# Patient Record
Sex: Female | Born: 1980 | Race: White | Hispanic: No | Smoking: Former smoker
Health system: Southern US, Community
[De-identification: ages and names within clinical notes are randomized; demographics above are authoritative.]

## PROBLEM LIST (undated history)

## (undated) DIAGNOSIS — T4145XA Adverse effect of unspecified anesthetic, initial encounter: Secondary | ICD-10-CM

## (undated) DIAGNOSIS — B9681 Helicobacter pylori [H. pylori] as the cause of diseases classified elsewhere: Secondary | ICD-10-CM

## (undated) DIAGNOSIS — K2970 Gastritis, unspecified, without bleeding: Secondary | ICD-10-CM

## (undated) DIAGNOSIS — N83209 Unspecified ovarian cyst, unspecified side: Secondary | ICD-10-CM

## (undated) DIAGNOSIS — R569 Unspecified convulsions: Secondary | ICD-10-CM

## (undated) HISTORY — DX: Helicobacter pylori (H. pylori) as the cause of diseases classified elsewhere: B96.81

## (undated) HISTORY — DX: Helicobacter pylori (H. pylori) as the cause of diseases classified elsewhere: K29.70

## (undated) HISTORY — PX: BRAIN SURGERY: SHX531

## (undated) HISTORY — DX: Unspecified convulsions: R56.9

## (undated) HISTORY — DX: Unspecified ovarian cyst, unspecified side: N83.209

## (undated) HISTORY — PX: KNEE SURGERY: SHX244

## (undated) HISTORY — PX: ABDOMINAL HYSTERECTOMY: SHX81

---
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
## 2003-04-07 ENCOUNTER — Inpatient Hospital Stay (HOSPITAL_COMMUNITY): Admission: EM | Admit: 2003-04-07 | Discharge: 2003-04-10 | Payer: Self-pay | Admitting: Psychiatry

## 2003-04-08 ENCOUNTER — Encounter: Payer: Self-pay | Admitting: Emergency Medicine

## 2004-07-03 ENCOUNTER — Emergency Department: Payer: Self-pay | Admitting: Emergency Medicine

## 2004-08-21 ENCOUNTER — Emergency Department: Payer: Self-pay | Admitting: Emergency Medicine

## 2006-05-14 ENCOUNTER — Emergency Department: Payer: Self-pay | Admitting: Emergency Medicine

## 2006-06-30 ENCOUNTER — Emergency Department: Payer: Self-pay

## 2007-03-29 ENCOUNTER — Emergency Department: Payer: Self-pay

## 2007-07-02 ENCOUNTER — Emergency Department: Payer: Self-pay | Admitting: Emergency Medicine

## 2007-08-21 ENCOUNTER — Ambulatory Visit: Payer: Self-pay | Admitting: Physician Assistant

## 2007-08-23 HISTORY — PX: OTHER SURGICAL HISTORY: SHX169

## 2007-11-05 ENCOUNTER — Emergency Department: Payer: Self-pay | Admitting: Emergency Medicine

## 2007-11-07 ENCOUNTER — Ambulatory Visit: Payer: Self-pay | Admitting: Family Medicine

## 2007-11-09 ENCOUNTER — Emergency Department: Payer: Self-pay | Admitting: Emergency Medicine

## 2008-05-19 ENCOUNTER — Emergency Department: Payer: Self-pay | Admitting: Emergency Medicine

## 2008-06-14 ENCOUNTER — Emergency Department: Payer: Self-pay | Admitting: Emergency Medicine

## 2008-09-07 ENCOUNTER — Emergency Department: Payer: Self-pay | Admitting: Emergency Medicine

## 2008-09-12 ENCOUNTER — Ambulatory Visit: Payer: Self-pay | Admitting: Obstetrics and Gynecology

## 2008-09-15 ENCOUNTER — Inpatient Hospital Stay: Payer: Self-pay | Admitting: Obstetrics and Gynecology

## 2008-10-09 ENCOUNTER — Ambulatory Visit: Payer: Self-pay | Admitting: Obstetrics and Gynecology

## 2009-07-17 ENCOUNTER — Emergency Department: Payer: Self-pay | Admitting: Emergency Medicine

## 2010-01-07 DIAGNOSIS — L709 Acne, unspecified: Secondary | ICD-10-CM | POA: Insufficient documentation

## 2010-12-25 ENCOUNTER — Emergency Department: Payer: Self-pay | Admitting: Emergency Medicine

## 2011-02-06 ENCOUNTER — Emergency Department: Payer: Self-pay | Admitting: Emergency Medicine

## 2011-03-26 ENCOUNTER — Emergency Department: Payer: Self-pay | Admitting: Emergency Medicine

## 2011-06-16 ENCOUNTER — Emergency Department: Payer: Self-pay | Admitting: Emergency Medicine

## 2011-07-19 ENCOUNTER — Ambulatory Visit: Payer: Self-pay | Admitting: Physician Assistant

## 2011-10-20 ENCOUNTER — Ambulatory Visit: Payer: Self-pay | Admitting: Family Medicine

## 2011-12-23 ENCOUNTER — Ambulatory Visit: Payer: Self-pay | Admitting: Family Medicine

## 2012-05-12 ENCOUNTER — Emergency Department: Payer: Self-pay | Admitting: Emergency Medicine

## 2012-05-12 LAB — URINALYSIS, COMPLETE
Bacteria: NONE SEEN
Blood: NEGATIVE
Glucose,UR: NEGATIVE mg/dL (ref 0–75)
Ketone: NEGATIVE
Specific Gravity: 1.025 (ref 1.003–1.030)
Squamous Epithelial: 8
WBC UR: 1 /HPF (ref 0–5)

## 2012-05-12 LAB — COMPREHENSIVE METABOLIC PANEL
Anion Gap: 9 (ref 7–16)
BUN: 14 mg/dL (ref 7–18)
Calcium, Total: 8.5 mg/dL (ref 8.5–10.1)
Chloride: 105 mmol/L (ref 98–107)
Co2: 28 mmol/L (ref 21–32)
EGFR (African American): >60
EGFR (Non-African Amer.): >60
Potassium: 3.9 mmol/L (ref 3.5–5.1)
SGOT(AST): 15 U/L (ref 15–37)
SGPT (ALT): 17 U/L (ref 12–78)

## 2012-05-12 LAB — CBC
HCT: 41.9 % (ref 35.0–47.0)
HGB: 14.4 g/dL (ref 12.0–16.0)
MCH: 31.7 pg (ref 26.0–34.0)
MCHC: 34.3 g/dL (ref 32.0–36.0)
MCV: 92 fL (ref 80–100)
RDW: 12.5 % (ref 11.5–14.5)

## 2012-05-17 ENCOUNTER — Ambulatory Visit: Payer: Self-pay | Admitting: Obstetrics and Gynecology

## 2012-05-17 LAB — PHENYTOIN LEVEL, TOTAL: Dilantin: 0.8 ug/mL — ABNORMAL LOW (ref 10.0–20.0)

## 2012-05-17 LAB — BASIC METABOLIC PANEL
Anion Gap: 7 (ref 7–16)
BUN: 10 mg/dL (ref 7–18)
Calcium, Total: 8.7 mg/dL (ref 8.5–10.1)
Chloride: 107 mmol/L (ref 98–107)
Co2: 26 mmol/L (ref 21–32)
Creatinine: 0.61 mg/dL (ref 0.60–1.30)
Osmolality: 277 (ref 275–301)

## 2012-05-17 LAB — PHENOBARBITAL LEVEL: Phenobarbital: 3 ug/mL — ABNORMAL LOW (ref 15.0–40.0)

## 2012-05-17 LAB — HEMOGLOBIN: HGB: 14.3 g/dL (ref 12.0–16.0)

## 2012-05-18 ENCOUNTER — Ambulatory Visit: Payer: Self-pay | Admitting: Obstetrics and Gynecology

## 2012-07-25 ENCOUNTER — Emergency Department: Payer: Self-pay | Admitting: Emergency Medicine

## 2012-07-25 LAB — CBC
HGB: 12.5 g/dL (ref 12.0–16.0)
MCH: 29.2 pg (ref 26.0–34.0)
MCHC: 31.8 g/dL — ABNORMAL LOW (ref 32.0–36.0)
MCV: 92 fL (ref 80–100)
Platelet: 234 10*3/uL (ref 150–440)
RBC: 4.28 10*6/uL (ref 3.80–5.20)
RDW: 13.2 % (ref 11.5–14.5)
WBC: 7.9 10*3/uL (ref 3.6–11.0)

## 2012-07-25 LAB — BASIC METABOLIC PANEL
Calcium, Total: 8.4 mg/dL — ABNORMAL LOW (ref 8.5–10.1)
Chloride: 105 mmol/L (ref 98–107)
EGFR (Non-African Amer.): >60
Glucose: 85 mg/dL (ref 65–99)
Potassium: 3.8 mmol/L (ref 3.5–5.1)
Sodium: 137 mmol/L (ref 136–145)

## 2012-07-25 LAB — URINALYSIS, COMPLETE
Bilirubin,UR: NEGATIVE
Glucose,UR: NEGATIVE mg/dL (ref 0–75)
Leukocyte Esterase: NEGATIVE
Nitrite: NEGATIVE
RBC,UR: <1 /HPF (ref 0–5)
Squamous Epithelial: <1

## 2012-07-25 LAB — HCG, QUANTITATIVE, PREGNANCY: Beta Hcg, Quant.: <1 m[IU]/mL — ABNORMAL LOW

## 2012-08-06 ENCOUNTER — Ambulatory Visit: Payer: Self-pay | Admitting: Obstetrics and Gynecology

## 2012-08-27 ENCOUNTER — Ambulatory Visit: Payer: Self-pay | Admitting: Obstetrics and Gynecology

## 2012-08-27 LAB — BASIC METABOLIC PANEL
Anion Gap: 7 (ref 7–16)
Chloride: 110 mmol/L — ABNORMAL HIGH (ref 98–107)
Co2: 27 mmol/L (ref 21–32)
Creatinine: 0.52 mg/dL — ABNORMAL LOW (ref 0.60–1.30)
EGFR (African American): >60
EGFR (Non-African Amer.): >60
Glucose: 85 mg/dL (ref 65–99)
Potassium: 3.8 mmol/L (ref 3.5–5.1)

## 2012-08-27 LAB — HEMOGLOBIN: HGB: 12.8 g/dL (ref 12.0–16.0)

## 2012-08-28 LAB — PREGNANCY, URINE: Pregnancy Test, Urine: NEGATIVE m[IU]/mL

## 2012-08-29 ENCOUNTER — Ambulatory Visit: Payer: Self-pay | Admitting: Obstetrics and Gynecology

## 2012-08-29 HISTORY — PX: SUPRACERVICAL ABDOMINAL HYSTERECTOMY: SHX5393

## 2012-08-29 HISTORY — PX: UNILATERAL SALPINGECTOMY: SHX6160

## 2012-08-30 LAB — BASIC METABOLIC PANEL
Anion Gap: 7 (ref 7–16)
BUN: 14 mg/dL (ref 7–18)
Creatinine: 0.72 mg/dL (ref 0.60–1.30)
EGFR (African American): >60
Glucose: 102 mg/dL — ABNORMAL HIGH (ref 65–99)
Osmolality: 286 (ref 275–301)
Sodium: 143 mmol/L (ref 136–145)

## 2012-08-30 LAB — HEMATOCRIT: HCT: 37.3 % (ref 35.0–47.0)

## 2012-09-22 DIAGNOSIS — T8859XA Other complications of anesthesia, initial encounter: Secondary | ICD-10-CM

## 2012-09-22 HISTORY — DX: Other complications of anesthesia, initial encounter: T88.59XA

## 2012-10-07 ENCOUNTER — Emergency Department: Payer: Self-pay | Admitting: Emergency Medicine

## 2012-10-07 LAB — COMPREHENSIVE METABOLIC PANEL
Albumin: 3.9 g/dL (ref 3.4–5.0)
Anion Gap: 5 — ABNORMAL LOW (ref 7–16)
Chloride: 108 mmol/L — ABNORMAL HIGH (ref 98–107)
EGFR (African American): >60
EGFR (Non-African Amer.): >60
SGOT(AST): 18 U/L (ref 15–37)
Sodium: 142 mmol/L (ref 136–145)
Total Protein: 7.4 g/dL (ref 6.4–8.2)

## 2012-10-07 LAB — URINALYSIS, COMPLETE
Bilirubin,UR: NEGATIVE
Blood: NEGATIVE
Glucose,UR: NEGATIVE mg/dL (ref 0–75)
Leukocyte Esterase: NEGATIVE
RBC,UR: 1 /HPF (ref 0–5)
Squamous Epithelial: 4
WBC UR: 1 /HPF (ref 0–5)

## 2012-10-07 LAB — CBC
HCT: 42.6 % (ref 35.0–47.0)
HGB: 14.3 g/dL (ref 12.0–16.0)
MCH: 31.5 pg (ref 26.0–34.0)
MCV: 94 fL (ref 80–100)
RDW: 12.8 % (ref 11.5–14.5)
WBC: 6.3 10*3/uL (ref 3.6–11.0)

## 2012-10-07 LAB — PREGNANCY, URINE: Pregnancy Test, Urine: NEGATIVE m[IU]/mL

## 2012-11-14 ENCOUNTER — Emergency Department: Payer: Self-pay | Admitting: Emergency Medicine

## 2013-01-03 ENCOUNTER — Emergency Department: Payer: Self-pay | Admitting: Emergency Medicine

## 2013-01-07 ENCOUNTER — Ambulatory Visit: Payer: Self-pay | Admitting: Obstetrics and Gynecology

## 2013-05-10 ENCOUNTER — Emergency Department: Payer: Self-pay | Admitting: Emergency Medicine

## 2013-05-10 LAB — URINALYSIS, COMPLETE
Bilirubin,UR: NEGATIVE
Blood: NEGATIVE
Glucose,UR: NEGATIVE mg/dL (ref 0–75)
Leukocyte Esterase: NEGATIVE
Nitrite: NEGATIVE
Ph: 7 (ref 4.5–8.0)
Protein: NEGATIVE
WBC UR: 3 /HPF (ref 0–5)

## 2013-05-10 LAB — CBC
HGB: 13.6 g/dL (ref 12.0–16.0)
MCH: 32.2 pg (ref 26.0–34.0)
MCHC: 33.8 g/dL (ref 32.0–36.0)
WBC: 7.6 10*3/uL (ref 3.6–11.0)

## 2013-05-10 LAB — BASIC METABOLIC PANEL
BUN: 14 mg/dL (ref 7–18)
Co2: 29 mmol/L (ref 21–32)
Creatinine: 0.68 mg/dL (ref 0.60–1.30)
EGFR (Non-African Amer.): >60
Glucose: 97 mg/dL (ref 65–99)
Sodium: 141 mmol/L (ref 136–145)

## 2013-05-10 LAB — WET PREP, GENITAL

## 2013-08-31 ENCOUNTER — Emergency Department: Payer: Self-pay | Admitting: Emergency Medicine

## 2013-08-31 LAB — COMPREHENSIVE METABOLIC PANEL
ALK PHOS: 78 U/L
Albumin: 3.5 g/dL (ref 3.4–5.0)
Anion Gap: 5 — ABNORMAL LOW (ref 7–16)
BILIRUBIN TOTAL: 0.2 mg/dL (ref 0.2–1.0)
BUN: 12 mg/dL (ref 7–18)
CHLORIDE: 108 mmol/L — AB (ref 98–107)
Calcium, Total: 8.5 mg/dL (ref 8.5–10.1)
Co2: 26 mmol/L (ref 21–32)
Creatinine: 0.63 mg/dL (ref 0.60–1.30)
EGFR (African American): >60
Glucose: 84 mg/dL (ref 65–99)
Osmolality: 276 (ref 275–301)
Potassium: 4.1 mmol/L (ref 3.5–5.1)
SGOT(AST): 11 U/L — ABNORMAL LOW (ref 15–37)
SGPT (ALT): 24 U/L (ref 12–78)
Sodium: 139 mmol/L (ref 136–145)
Total Protein: 6.8 g/dL (ref 6.4–8.2)

## 2013-08-31 LAB — CBC WITH DIFFERENTIAL/PLATELET
Basophil #: 0 10*3/uL (ref 0.0–0.1)
Basophil %: 0.7 %
EOS PCT: 0.5 %
Eosinophil #: 0 10*3/uL (ref 0.0–0.7)
HCT: 39.8 % (ref 35.0–47.0)
HGB: 13.6 g/dL (ref 12.0–16.0)
LYMPHS PCT: 40.2 %
Lymphocyte #: 2.7 10*3/uL (ref 1.0–3.6)
MCH: 31.5 pg (ref 26.0–34.0)
MCHC: 34.1 g/dL (ref 32.0–36.0)
MCV: 92 fL (ref 80–100)
Monocyte #: 0.5 x10 3/mm (ref 0.2–0.9)
Monocyte %: 7.7 %
Neutrophil #: 3.4 10*3/uL (ref 1.4–6.5)
Neutrophil %: 50.9 %
PLATELETS: 212 10*3/uL (ref 150–440)
RBC: 4.32 10*6/uL (ref 3.80–5.20)
RDW: 13 % (ref 11.5–14.5)
WBC: 6.7 10*3/uL (ref 3.6–11.0)

## 2013-08-31 LAB — URINALYSIS, COMPLETE
BLOOD: NEGATIVE
Bilirubin,UR: NEGATIVE
GLUCOSE, UR: NEGATIVE mg/dL (ref 0–75)
Leukocyte Esterase: NEGATIVE
Nitrite: NEGATIVE
Ph: 5 (ref 4.5–8.0)
Protein: 30
Specific Gravity: 1.032 (ref 1.003–1.030)
Squamous Epithelial: 8
WBC UR: 1 /HPF (ref 0–5)

## 2014-12-09 NOTE — Op Note (Signed)
PATIENT NAME:  Wendy Ferguson, Wendy Ferguson MR#:  [id number] DATE OF BIRTH:  [birth year]  DATE OF PROCEDURE:  05/18/2012  PREOPERATIVE DIAGNOSES:   1. Acute onset right lower quadrant pain.  2. Right ovarian cyst.  POSTOPERATIVE DIAGNOSES:  1. Extensive pelvic adhesions. 2. Right ovarian cyst.   PROCEDURES:  1. Laparoscopic adhesiolysis extensive.  2. Right ovarian cystotomy.  3. Cystoscopy.   SURGEON:  Suzy Bouchardhomas J. Anhar Mcdermott, MD.   ANESTHESIA: General endotracheal anesthesia.   INDICATIONS: This is a 34 year old gravida 1, para 1 patient with a four day history of acute onset right lower quadrant pain that has been constant. The patient had an ultrasound that showed a 2 x 2 cm ovarian cyst. The patient is status post an exploratory laparotomy with a left ovarian cystectomy consistent with dermoid and also an appendectomy.   PROCEDURE: After adequate general endotracheal anesthesia, the patient was placed in the dorsal supine position with the legs in the [employer] stirrups. Abdominal, perineal and vaginal prep performed with Betadine and sterile dressing applied. A single-tooth tenaculum was then applied to the anterior cervix after inserting a speculum and a Kahn cannula placed into the endocervical cavity to be used for uterine manipulation during the procedure. A 12 mm infraumbilical incision was made. Upon prior injection with 0.5% Marcaine, the laparoscope was advanced into the abdominal cavity under direct visualization with the Optiview cannula. The patient's abdomen was insufflated. Initial impression had marked adhesions, omental adhesions to the anterior abdominal wall, several, had dense adhesions of the uterus to the anterior abdominal wall. At the second port site, a 10 mm port was advanced through the left lower quadrant area 2 cm medial to the left anterior iliac spine. Direct visualization used to place port. Third port site was placed in the right lower quadrant, again, 2 cm medial to the  right anterior iliac spine. Harmonic scalpel was brought up for the next 75% of the case operating time. Adhesiolysis ensued with first taking down omental adhesions that were close to the infraumbilical port site. Once these were freed up, the patient's left round ligament was clamped, cauterized and excised and then peritoneal and subperitoneal incisions were made to free the dense adhesions of the uterus to the anterior abdominal wall. Ultimately, the uterus was freed. Good hemostasis was noted. Attention was then directed to the patient's right fallopian tube which had a corpus luteum on this. It was approximately 4 x 4 centimeters in diameter. Given the prior history of dermoid, an ovarian cystotomy was performed and clear fluid resulted. There was no evidence of a dermoid in this ovary. Kleppinger was used to control the oozing at the capsule site. Attention was directed to the patient's left fallopian tube and ovary which appeared normal. There was a dense adhesion from the colon to the left ovary. This was taken down sharply without any involvement of the bowel. Given the extensive adhesiolysis on the left side, peristaltic activity of the ureter could not be ascertained. Therefore, cystoscope was brought up to the operative field and the patient received intravenous indigo carmine. Ureters were evaluated and there was a normal pluming effect efflux from each ureter identified. Attention was then directed to the patient's abdomen. Good hemostasis noted intraoperatively. Intraoperative pressure was brought down to 6 mmHg and good hemostasis noted. All instruments were removed. The patient's abdomen was deflated and incisions were closed at the fascial layer at the infraumbilical and left lower port sites and then skin was reapproximated with interrupted 4-0 Vicryl sutures  and Steri-Strips applied and a pressure dressing. A single-tooth tenaculum was removed from the anterior cervix. Silver nitrate applied to  the cervix to help with hemostasis. There were no complications. Estimated blood loss minimal. Intraoperative fluids 1 liter. The patient was taken to the recovery room in good condition.   ____________________________ Suzy Bouchard, MD tjs:ap D: 05/18/2012 12:36:31 ET T: 05/18/2012 13:52:57 ET JOB#: [id number]  cc: Suzy Bouchard, MD, <Dictator> Suzy Bouchard MD ELECTRONICALLY SIGNED 05/19/2012 9:42

## 2014-12-12 NOTE — Op Note (Signed)
PATIENT NAME:  Wendy Ferguson, Wendy Ferguson MR#:  [id number] DATE OF BIRTH:  1981/05/29  DATE OF PROCEDURE:  08/29/2012  PREOPERATIVE DIAGNOSIS: Chronic pelvic pain, right-sided.   POSTOPERATIVE DIAGNOSIS: Chronic pelvic pain, right-sided.   PROCEDURE:  1. Laparoscopic supracervical hysterectomy.  2. Left salpingectomy.  3. Right salpingo-oophorectomy.   ANESTHESIA: Endotracheal anesthesia.   SURGEON: Suzy Bouchard, MD   FIRST ASSISTANT: Ricky L. Logan Bores, MD   INDICATIONS: A 34 year old gravida 1, para 1 patient with a history of chronic pelvic pain, right greater than left. The patient is status post laparoscopic adhesiolysis, with temporary relief of pain, but pain has returned. The patient does have a history of heavy menstrual flow. The patient declines future childbearing capacity.   PROCEDURE: After adequate general endotracheal anesthesia, the patient was placed in the dorsal supine position. The patient's legs were placed in the [facility] stirrups. The patient's abdomen, perineum and vagina were prepped and draped in normal sterile fashion. A speculum was placed in the vagina, and the anterior cervix was grasped with a single-tooth tenaculum and a uterine sound placed into the endometrial cavity. Tenaculum and sound were joined with Steri-Strips. These will be used for uterine manipulation during the procedure. A Foley catheter placed, yielding 100 mL of clear urine. Gloves were changed. A 12 mm infraumbilical incision was made after injecting with 0.5% Marcaine. The laparoscope was advanced into the abdominal cavity under direct visualization with the Optiview cannula. Upon entry into the abdominal cavity, the patient's abdomen was insufflated with carbon dioxide. A #10-11 port was then placed at previously placed port from last surgery. Under direct visualization, the trocar was advanced after injecting with 0.5% Marcaine. Similarly, a third port, a 10-11 mm port was placed on the right lower  quadrant, direct visualization used to gain entrance into the abdominal cavity. Initial impression: No significant adhesions, some anterior adhesions to the lower uterine segment that were dense in nature. The upper abdomen appeared normal. A small omental adhesion to the umbilicus. No bowel incorporated in this. The appendix was not visualized. No significant adhesions in the right aspect of the abdomen. Attention was then directed to the patient's left fallopian tube, which was grasped at the fimbriated portion. Harmonic scalpel was used to dissect the left fallopian tube, and this was removed for pathologic evaluation. The round ligament on the left was then clamped and transected with the Harmonic scalpel. The utero-ovarian ligament was then cauterized with the Kleppinger's and transected from the uterus, and several bites were used to clamp the left broad ligament and transect it, and dissection continued down to the isthmic portion of the uterus, where the left uterine artery was visualized. Anterior bladder was taken down sharply from the lower uterine segment. Kleppinger cautery was used to cauterize the left uterine artery. Harmonic used to transect and to dissect to the cervical stump. A similar procedure was repeated on the patient's right side. The right tube and ovary were grasped and placed on medial traction, while the infundibulopelvic ligament was cauterized and opened. Serial clamps through the right broad ligament with the Harmonic scalpel, and again, the right uterine artery was identified, cauterized and transected, and cervical stump was then transected and removed from the uterine sound. The uterine sound was removed, and the remaining cervical stump was then cauterized with the Kleppinger's, aided by a vaginal hand. Good hemostasis was noted. The morcellator was then brought up to the operative field and advanced through the left lower port site, and the uterus, right  fallopian tube and ovary  were then morcellated. The patient's abdomen was copiously irrigated. Good hemostasis noted. Pressure was lowered to 7 mmHg, and good hemostasis still noted. All instruments were removed after deflating the patient's abdomen, and the infraumbilical and left lower port sites were closed with a fascial layer of 2-0 Vicryl and interrupted 4-0 Vicryl for the skin, and the right port site closed with interrupted 4-0 Vicryl skin suture. Sterile dressings applied. Cervix was evaluated at the end of the case. No active bleeding. The patient tolerated the procedure well. Estimated blood loss 25 mL. Intraoperative fluids 1000 mL. Urine output 100 mL. The patient did receive 2 grams of cefoxitin prior to commencement of the case, and the patient was taken to the recovery room in good condition.   ____________________________ Suzy Bouchardhomas J. Schermerhorn, MD tjs:OSi D: 08/29/2012 11:10:57 ET T: 08/29/2012 11:47:34 ET JOB#: [id number]  cc: Suzy Bouchardhomas J. Schermerhorn, MD, <Dictator> Suzy BouchardHOMAS J SCHERMERHORN MD ELECTRONICALLY SIGNED 09/06/2012 10:53

## 2014-12-12 NOTE — Discharge Summary (Signed)
PATIENT NAME:  Wendy Ferguson, Oberia E MR#:  [id number] DATE OF BIRTH:  Oct 11, 1980  DATE OF ADMISSION:  08/29/2012 DATE OF DISCHARGE:  08/30/2012  HOSPITAL COURSE: The patient underwent uncomplicated laparoscopic supracervical hysterectomy, right salpingo-oophorectomy and a left salpingectomy for chronic pelvic pain. Postoperatively, the patient did well. Postoperative day #1, hematocrit 37.3%, BUN and creatinine of 14 and 0.7. The patient was tolerating a regular diet on discharge, pain under control handled with Percocet.   DISCHARGE MEDICATIONS: Percocet 7.5/325 one to two every 6 hours as needed for pain, Naprosyn 500 mg 1 tablet q.12 hours as needed for pain, Colace 100 mg daily. The patient will continue on her Dilantin and phenobarbital at home.   DISCHARGE INSTRUCTIONS: The patient is given precautions to return to clinic in 2 weeks for wound care or before if she has increasing abdominal pain, nausea, vomiting, fever. The patient is to refrain from sexual activity and nothing in the vagina.     ____________________________ Suzy Bouchardhomas J. Schermerhorn, MD tjs:es D: 08/30/2012 09:54:57 ET T: 08/30/2012 10:21:11 ET JOB#: [id number]  cc: Suzy Bouchardhomas J. Schermerhorn, MD, <Dictator> Suzy BouchardHOMAS J SCHERMERHORN MD ELECTRONICALLY SIGNED 09/06/2012 10:53

## 2014-12-24 LAB — CBC AND DIFFERENTIAL
HCT: 41 % (ref 36–46)
Hemoglobin: 13.7 g/dL (ref 12.0–16.0)
PLATELETS: 338 10*3/uL (ref 150–399)
WBC: 6.2 10*3/mL

## 2015-01-15 LAB — HEPATIC FUNCTION PANEL
ALT: 42 U/L — AB (ref 7–35)
AST: 20 U/L (ref 13–35)

## 2015-01-15 LAB — BASIC METABOLIC PANEL
BUN: 11 mg/dL (ref 4–21)
CREATININE: 0.7 mg/dL (ref 0.5–1.1)
GLUCOSE: 78 mg/dL
POTASSIUM: 4.9 mmol/L (ref 3.4–5.3)
Sodium: 139 mmol/L (ref 137–147)

## 2015-01-15 LAB — TSH: TSH: 1.7 u[IU]/mL (ref 0.41–5.90)

## 2015-01-26 ENCOUNTER — Telehealth: Payer: Self-pay

## 2015-01-26 NOTE — Telephone Encounter (Signed)
Probably related to her medications since the rest of her labs were normal. . She will need to discuss this with her neurologist.

## 2015-01-26 NOTE — Telephone Encounter (Signed)
Tried calling patient. Left message to call back. 

## 2015-01-26 NOTE — Telephone Encounter (Signed)
Patient called stating she was seen on 01/15/2015 and was started on Vitamin D. Patient has been taking vitamin D as prescribes and states her symptoms of fatigue have improved, but she also reports that she still feels lightheaded and has an unstable gait. Patient still feels weak and has no energy at times. Patient thinks there may be something else going on with her body. Patient wants advise on what she should do.

## 2015-01-27 NOTE — Telephone Encounter (Signed)
Patient advised and verbally voiced understanding.  

## 2015-02-02 ENCOUNTER — Telehealth: Payer: Self-pay | Admitting: Family Medicine

## 2015-02-02 NOTE — Telephone Encounter (Signed)
Pt wanted to speak with Dr. Theodis Aguas nurse because she doesn't think she needs to go to the neuro appt because now that she has been on Vitamin D she feels a lot better. I advised that I would send the message to the nurse but if she would like that appt with Dr. Malvin Johns canceled she would need to call that office. Thanks TNP

## 2015-02-03 NOTE — Telephone Encounter (Signed)
Patient stated that she is going to go to the neurologist but she pushed the date back due to financial reasons. Patient has appt on 7/25 at 10:00 am. Patient stated that she is feeling a lot better and more normal since starting vitamin D daily.

## 2015-02-20 ENCOUNTER — Telehealth: Payer: Self-pay | Admitting: Family Medicine

## 2015-02-20 NOTE — Telephone Encounter (Signed)
Ok to take over the counter Claritin, or Zyrtec, without "D", also can try otc nasal spray, like Flonase. That works very well. Thanks.

## 2015-02-20 NOTE — Telephone Encounter (Signed)
Patient advised and verbally voiced understanding.  

## 2015-02-20 NOTE — Telephone Encounter (Signed)
Pt called saying she was having allergies really bad and needs to know something that she can take without benedryl.  CB [phone]  tp

## 2015-03-16 DIAGNOSIS — R569 Unspecified convulsions: Secondary | ICD-10-CM | POA: Insufficient documentation

## 2015-03-20 ENCOUNTER — Other Ambulatory Visit: Payer: Self-pay | Admitting: Family Medicine

## 2015-03-20 DIAGNOSIS — M25561 Pain in right knee: Secondary | ICD-10-CM | POA: Insufficient documentation

## 2015-03-23 ENCOUNTER — Other Ambulatory Visit: Payer: Self-pay | Admitting: Neurology

## 2015-03-23 DIAGNOSIS — R569 Unspecified convulsions: Secondary | ICD-10-CM

## 2015-03-25 ENCOUNTER — Other Ambulatory Visit: Payer: Self-pay | Admitting: Family Medicine

## 2015-03-26 NOTE — Telephone Encounter (Signed)
Please call in the following medication.  [facility] [phone]   Surescripts Out Interface  Yesterday   (4:46 PM)         New medications from outside sources are available for reconciliation.       Requested Medications     Medication name:  Name from pharmacy:  PHENobarbital (LUMINAL) 64.8 MG tablet PHENOBARBITAL 64.8MG  TAB    Sig: TAKE ONE TABLET TWICE DAILY    Dispense: 60 tablet (Pharmacy requested 60 each)    rf x 2     .

## 2015-03-26 NOTE — Telephone Encounter (Signed)
Next ov is on 04/28/2015

## 2015-03-27 NOTE — Telephone Encounter (Signed)
RX called in-aa 

## 2015-04-21 DIAGNOSIS — IMO0001 Reserved for inherently not codable concepts without codable children: Secondary | ICD-10-CM | POA: Insufficient documentation

## 2015-04-21 DIAGNOSIS — R519 Headache, unspecified: Secondary | ICD-10-CM | POA: Insufficient documentation

## 2015-04-21 DIAGNOSIS — R51 Headache: Secondary | ICD-10-CM

## 2015-04-21 DIAGNOSIS — R6889 Other general symptoms and signs: Secondary | ICD-10-CM

## 2015-04-28 ENCOUNTER — Ambulatory Visit: Payer: Medicaid Other

## 2015-04-30 ENCOUNTER — Telehealth: Payer: Self-pay

## 2015-04-30 NOTE — Telephone Encounter (Signed)
Mardella Layman from [facility]  called on 04/29/2015 and spoke with our call a nurse line. Per the message that was taken by the Call a nurse line Mardella Layman is requesting information about the patients knee to order MRI. I tried calling Mardella Layman back at the number listed on this message [phone]. Mardella Layman was at lunch and I left a message on her voice message system to call us back.

## 2015-05-01 NOTE — Telephone Encounter (Signed)
Mardella Layman from [facility] states that she is trying to get an MRI done for the patient. She is requesting office/ progress notes regarding the knee pain to get prior authorization for conservative treatment. I couldn't find any office notes for knee pain. I saw where there was an order placed for Orthopedic referral on 03/20/2015.

## 2015-05-06 NOTE — Telephone Encounter (Signed)
We don't have any office notes regarding her knee pain.

## 2015-05-07 NOTE — Telephone Encounter (Signed)
Lindsey advised 

## 2015-05-12 ENCOUNTER — Other Ambulatory Visit: Payer: Self-pay | Admitting: Orthopedic Surgery

## 2015-05-12 DIAGNOSIS — M25561 Pain in right knee: Secondary | ICD-10-CM

## 2015-05-14 ENCOUNTER — Other Ambulatory Visit: Payer: Medicaid Other

## 2015-05-14 ENCOUNTER — Ambulatory Visit: Payer: Medicaid Other

## 2015-05-20 ENCOUNTER — Ambulatory Visit
Admission: RE | Admit: 2015-05-20 | Discharge: 2015-05-20 | Disposition: A | Discharge status: Home / Self Care | Payer: Medicaid Other | Source: Ambulatory Visit | Attending: Orthopedic Surgery | Admitting: Orthopedic Surgery

## 2015-05-20 DIAGNOSIS — M659 Synovitis and tenosynovitis, unspecified: Secondary | ICD-10-CM | POA: Diagnosis not present

## 2015-05-20 DIAGNOSIS — M179 Osteoarthritis of knee, unspecified: Secondary | ICD-10-CM | POA: Diagnosis not present

## 2015-05-20 DIAGNOSIS — S83511D Sprain of anterior cruciate ligament of right knee, subsequent encounter: Secondary | ICD-10-CM | POA: Diagnosis not present

## 2015-05-20 DIAGNOSIS — M25561 Pain in right knee: Secondary | ICD-10-CM

## 2015-05-20 DIAGNOSIS — X58XXXD Exposure to other specified factors, subsequent encounter: Secondary | ICD-10-CM | POA: Insufficient documentation

## 2015-06-02 ENCOUNTER — Ambulatory Visit: Payer: Medicaid Other | Attending: Orthopedic Surgery | Admitting: Physical Therapy

## 2015-06-02 ENCOUNTER — Encounter: Payer: Self-pay | Admitting: Physical Therapy

## 2015-06-02 DIAGNOSIS — M25561 Pain in right knee: Secondary | ICD-10-CM | POA: Diagnosis not present

## 2015-06-02 DIAGNOSIS — M24661 Ankylosis, right knee: Secondary | ICD-10-CM | POA: Insufficient documentation

## 2015-06-02 DIAGNOSIS — R29898 Other symptoms and signs involving the musculoskeletal system: Secondary | ICD-10-CM | POA: Diagnosis present

## 2015-06-02 NOTE — Patient Instructions (Signed)
Quad Set    With other leg bent, foot flat, slowly tighten muscles on thigh of straight leg while counting out loud to _3___. Repeat with other leg. Repeat ___8_ times. Do 2 sessions per day.  http://[email or web address].exer.us/276   Copyright  VHI. All rights reserved.  Clam Shell 45 Degrees    Lying with hips and knees bent 45, one pillow between knees and ankles. Lift knee. Be sure pelvis does not roll backward. Do not arch back. Do __8_ times, each leg, _2__ times per day.  http://ss.exer.us/75   Copyright  VHI. All rights reserved.  Back Hyperextension: Using Arms    Lying face down with arms bent, inhale. Then while exhaling, straighten arms. Hold ____ seconds. Slowly return to starting position. Repeat _5 ___ times per set. Do ___2_ sets per session. Do _3-4___ sessions per day.  Copyright  VHI. All rights reserved.  (Home) Flexion: Pelvic Tilt    Lie with neck supported, knees bent, feet flat. Tighten and suck stomach in, pushing back down against surface. Do not push down with legs. Repeat _10___ times per set. Do _2___ sets per session. Do _6__ sessions per week.  Copyright  VHI. All rights reserved.

## 2015-06-03 NOTE — Therapy (Signed)
[place] MAIN [facility] SERVICES [address] [place], [place], [address] Phone: [phone]   Fax:  [phone]  Physical Therapy Evaluation  Patient Details  Name: Wendy Ferguson MRN: [id number] Date of Birth: Nov 11, 1980 Referring Provider:  Thornton Park, MD  Encounter Date: 06/02/2015      PT End of Session - 06/02/15 1322    Visit Number 1   Number of Visits 1   Date for PT Re-Evaluation 06/09/15   Authorization Type medicaid   PT Start Time 0950   PT Stop Time 1030   PT Time Calculation (min) 40 min   Activity Tolerance Patient limited by pain   Behavior During Therapy [facility] for tasks assessed/performed      Past Medical History  Diagnosis Date  . Anxiety   . Allergy   . Seizures [facility])     Past Surgical History  Procedure Laterality Date  . Knee surgery Right 08/22/17  . Knee surgery Right 1989    Fell while having seizer.     There were no vitals filed for this visit.  Visit Diagnosis:  Right knee pain - Plan: PT plan of care cert/re-cert  Decreased range of motion of knee, right - Plan: PT plan of care cert/re-cert  Right leg weakness - Plan: PT plan of care cert/re-cert      Subjective Assessment - 06/02/15 0957    Subjective Patient reports to PT with complaints of  R knee pain as well as locking, and swelling. She states that within the last month she noticed increased swelling in the knee with insidious onset. This patient does have a history of chronic knee pain that started when she was 34 years old and fell off a rope bridge requiring knee surgery for ACL and cartilage damage. She states that she has has a hard time walking up and down stairs due to knee and back pain. Also reports that she has only minor difficulty with ADLs including cleaning and meal prep due to knee pain.    Pertinent History Histroy of seizures, anxiety, R knee surgery, R ACL tear    Limitations Sitting   How long can you sit  comfortably? 45 minutes    How long can you stand comfortably? 15 minutes    How long can you walk comfortably? 10 minutes    Diagnostic tests MRI - chronic ACL injury, mild cartilage changes, increased fluid near patella.    Patient Stated Goals be able to walk up and down stairs.    Currently in Pain? Yes   Pain Score 8    Pain Location Knee   Pain Orientation Right   Pain Descriptors / Indicators Throbbing;Sharp   Pain Type Chronic pain   Pain Onset 1 to 4 weeks ago   Pain Frequency Constant   Aggravating Factors  walking, walking up stair    Pain Relieving Factors lying down,    Effect of Pain on Daily Activities decreased mobility             [facility] PT Assessment - 06/03/15 0001    Assessment   Medical Diagnosis r knee pain    Onset Date/Surgical Date 05/12/15   Hand Dominance Right   Next MD Visit 07/07/2015   Prior Therapy PT 3 years prior for the R knee    Precautions   Precautions Fall   Precaution Comments Dificulty walking    Restrictions   Weight Bearing Restrictions No   Balance Screen   Has  the patient fallen in the past 6 months No  6-7 near falls    Has the patient had a decrease in activity level because of a fear of falling?  Yes   Is the patient reluctant to leave their home because of a fear of falling?  Yes   [place] residence   Living Arrangements Non-relatives/Friends   Available Help at Discharge Family;Friend(s)   Type of Home Apartment   Home Access Level entry   [place] None   Prior Function   Level of Independence Independent   Vocation On disability   Vocation Requirements On disability since childhood    Leisure play games, sing, running    Cognition   Overall Cognitive Status Within Functional Limits for tasks assessed   Observation/Other Assessments   Lower Extremity Functional Scale  32/80 (The lower the score the greater the disability)   Sensation   Light  Touch Appears Intact   Posture/Postural Control   Posture Comments Patient sits and stands with rounded shoulders and forward head posture.    AROM   Overall AROM Comments R knee extension -45, R hip flexion 105. R ankle DF: -5 degrees. all L LE motions WNL   PROM   Overall PROM Comments In supine knee extension - 35 degrees. knee flexion: 95 degrees. Hip flexion: 110.   Strength   Overall Strength Comments R LE strength assessment 4-/5 for hip flexion, abduction, adduction, knee flexion/extension, ankle DF/PF. all L LE MMT at least 4+/5     Palpation   Palpation comment Patient reports that she is very tender to palpation in all aspects of the R knee as well as in the R calf, along the R IT band, in the R glute med area, and the R sided lumbar region.    Special Tests   Laxity/Instability  --  Patients knee too irritable for accurate laxity assessment.    Anterior drawer test   Findings Positive   Side Right   Comment Negative on L. Test was very painful on the R LE    Posterior drawer test   Findings Negative   Side  Right   Comments Test was very painful for patient    Bed Mobility   Bed Mobility --  patient was independent for all bedmobility    Ambulation/Gait   Gait velocity 0.65ms  <1.0 m/s= increased fall risk and decreased commnuity access   Gait Comments Patient demonstrated significant antalgic gait pattern with decreased stance time on the R LE, decrease step length on the R LE. Patient also demonstrated absent heel strike on the R LE with foot contact occuring on the met heads in PF/IV on the R. R knee also remains flexed throughout gait cycle.          Treatment: (4 min)  Patient instructed in HEP.  Quad set 2x 5  Short arc quad 2x 5  Sidelying clamshells. 2x 5  Prone extension x 5   PT provided moderate instruction for proper exercise set up, cues for reduced compensation, and improved ROM  Patient was noted to have difficulty with therex due to knee and back  pain, mild improvement in ROM noted with cues to correct positioning and reduce stress on the knee.                    PT Education - 06/02/15 1322    Education provided Yes  Education Details Plan of Care, Home exercises.    Person(s) Educated Patient   Methods Explanation;Demonstration;Tactile cues;Verbal cues   Comprehension Verbalized understanding;Returned demonstration;Verbal cues required;Tactile cues required             PT Long Term Goals - 06/03/15 0759    PT LONG TERM GOAL #1   Title Patient will be independent with HEP to improve strength and motion to allow return to PLOF by 06/09/15   Time 1   Period Weeks   Status New               Plan - 06/03/15 0749    Clinical Impression Statement This patient presents to PT due to acute on chronic knee pain that she reports is 8/10 at rest. Patient is found to have significant decrease in R LE strength and ROM secondary to knee pain. Tenderness to palpation was also found in all aspects of the R LE with the most tender location at the anterior joint line, medial and lateral of the patella. Tenderness was also found in the r sided low back to palpation. She reports decreased function as evidenced on the LEFS with a score of 33/80. Gait speed was also reduced due to antalgic gait pattern on the R LE, indicating decreased community access and increased fall risk. Patient was instructed in HEP to improve knee, hip and back mobility as well as strength, but was noted to have difficulty with exercises due to severity of the pain in the knee. Patient also educated about the pro-bono PT [facility] clinic due to financial restrictions limiting future PT. This patient would benefit from skilled PT to address the deficits found at PT eval to improve function within the home and the community. However, she has limtied financial resources and is unable to self pay. Will be discharged at this time.     Pt will benefit from skilled  therapeutic intervention in order to improve on the following deficits Difficulty walking;Decreased strength;Decreased activity tolerance;Decreased range of motion;Decreased safety awareness;Hypomobility;Increased edema;Increased fascial restricitons;Impaired perceived functional ability;Impaired flexibility;Improper body mechanics   Rehab Potential Fair   Clinical Impairments Affecting Rehab Potential Positive: age, motivated to improve. Negative: severity and chronic condition.    PT Frequency One time visit   PT Treatment/Interventions ADLs/Self Care Home Management   PT Next Visit Plan one time visit   PT Home Exercise Plan See patient instructions.    Consulted and Agree with Plan of Care Patient         Problem List Patient Active Problem List   Diagnosis Date Noted  . Knee pain, right 03/20/2015   Barrie Folk SPT 06/03/2015   2:38 PM  This entire session was performed under direct supervision and direction of a licensed therapist. I have personally read, edited and approve of the note as written.  Hopkins,Margaret PT, DPT 06/03/2015, 2:38 PM  [place] MAIN [facility] SERVICES [address] [place], [place], [address] Phone: [phone]   Fax:  [phone]

## 2015-06-22 ENCOUNTER — Ambulatory Visit: Payer: Medicaid Other

## 2015-07-06 ENCOUNTER — Ambulatory Visit
Admission: RE | Admit: 2015-07-06 | Discharge: 2015-07-06 | Disposition: A | Discharge status: Home / Self Care | Payer: Medicaid Other | Source: Ambulatory Visit | Attending: Neurology | Admitting: Neurology

## 2015-07-06 DIAGNOSIS — G319 Degenerative disease of nervous system, unspecified: Secondary | ICD-10-CM | POA: Insufficient documentation

## 2015-07-06 DIAGNOSIS — R569 Unspecified convulsions: Secondary | ICD-10-CM | POA: Diagnosis not present

## 2015-07-21 ENCOUNTER — Telehealth: Payer: Self-pay | Admitting: Family Medicine

## 2015-07-21 NOTE — Telephone Encounter (Signed)
Pt called back and her orthopedic doctor does need approval from Dr. Sherrie MustacheFisher for her to have the knee surgery .  We should have a form here for him to sign  Her call back is  [phone]  Thanks Barth Kirkseri

## 2015-07-21 NOTE — Telephone Encounter (Signed)
She needs to schedule office visit.

## 2015-07-21 NOTE — Telephone Encounter (Signed)
Advised patient. Patient reports that she was aware, and she has been cleared by neuro. She says to disregard the form.

## 2015-07-21 NOTE — Telephone Encounter (Signed)
Pt is requesting a call back.  CB#[phone]/MW

## 2015-07-21 NOTE — Telephone Encounter (Signed)
Dr. Sherrie MustacheFisher, earlier today I told patient that she needed to be cleared by neuro. She says that you need to clear her as well. Does patient need OV? If so, when can she come in? Please advise. Thanks!

## 2015-07-21 NOTE — Telephone Encounter (Signed)
[facility]. Patient needs to be cleared by her neurologist. When she was last seen here in May she had appointment scheduled with Dr. Malvin JohnsPotter who needs to clear for surgery.

## 2015-07-22 NOTE — Telephone Encounter (Signed)
Patient notified and appt scheduled.

## 2015-07-29 ENCOUNTER — Encounter: Payer: Self-pay | Admitting: Family Medicine

## 2015-07-29 ENCOUNTER — Ambulatory Visit (INDEPENDENT_AMBULATORY_CARE_PROVIDER_SITE_OTHER): Payer: Medicaid Other | Admitting: Family Medicine

## 2015-07-29 VITALS — BP 100/70 | HR 83 | Temp 98.5°F | Resp 16 | Ht 66.0 in | Wt 161.0 lb

## 2015-07-29 DIAGNOSIS — M25539 Pain in unspecified wrist: Secondary | ICD-10-CM | POA: Insufficient documentation

## 2015-07-29 DIAGNOSIS — R51 Headache: Secondary | ICD-10-CM

## 2015-07-29 DIAGNOSIS — R519 Headache, unspecified: Secondary | ICD-10-CM | POA: Insufficient documentation

## 2015-07-29 DIAGNOSIS — N83202 Unspecified ovarian cyst, left side: Secondary | ICD-10-CM | POA: Insufficient documentation

## 2015-07-29 DIAGNOSIS — Z8619 Personal history of other infectious and parasitic diseases: Secondary | ICD-10-CM | POA: Insufficient documentation

## 2015-07-29 DIAGNOSIS — R200 Anesthesia of skin: Secondary | ICD-10-CM | POA: Insufficient documentation

## 2015-07-29 DIAGNOSIS — E559 Vitamin D deficiency, unspecified: Secondary | ICD-10-CM | POA: Insufficient documentation

## 2015-07-29 DIAGNOSIS — M25569 Pain in unspecified knee: Secondary | ICD-10-CM | POA: Diagnosis not present

## 2015-07-29 DIAGNOSIS — G40909 Epilepsy, unspecified, not intractable, without status epilepticus: Secondary | ICD-10-CM

## 2015-07-29 DIAGNOSIS — R5383 Other fatigue: Secondary | ICD-10-CM | POA: Insufficient documentation

## 2015-07-29 DIAGNOSIS — G4710 Hypersomnia, unspecified: Secondary | ICD-10-CM | POA: Insufficient documentation

## 2015-07-29 MED ORDER — TRAMADOL HCL 50 MG PO TABS: 50.0000 mg | ORAL_TABLET | Freq: Four times a day (QID) | ORAL | Status: DC | PRN

## 2015-07-29 NOTE — Progress Notes (Signed)
Patient: Wendy Ferguson Female    DOB: [birth year]   34 y.o.   MRN: [id number] Visit Date: 07/29/2015  Today's Provider: [name], MD   Chief Complaint  Patient presents with  . Pre-op Exam   Subjective:    HPI  Patient is here for surgical clearance. She is scheduled for arthroscopic surgery of her right knee on 08-19-15. She has had several surgeries under [facility] in the past and denies having any surgical or anesthetic complications. She is followed follower by Dr. Malvin Johns for seizure disorder which she states has been well controlled. Denies chest pain, heart flutters and shortness of breath  Allergies  Allergen Reactions  . Erythromycin   . Tylenol  [Acetaminophen]    Previous Medications   DICLOFENAC (VOLTAREN) 50 MG EC TABLET    Take 1 tablet by mouth 2 (two) times daily as needed. For pain   DIVALPROEX (DEPAKOTE) 500 MG DR TABLET    Take 500 mg by mouth 3 (three) times daily.   FOLIC ACID (FOLVITE) 1 MG TABLET    Take 1 mg by mouth daily.   NORTRIPTYLINE (PAMELOR) 10 MG CAPSULE    Take 10 mg by mouth at bedtime.   PHENOBARBITAL (LUMINAL) 64.8 MG TABLET    TAKE ONE TABLET TWICE DAILY   PHENYTOIN (DILANTIN) 100 MG ER CAPSULE    Take 2 capsules by mouth. every morning , then take 2 capsules at night   VITAMIN D, ERGOCALCIFEROL, (DRISDOL) 50000 UNITS CAPS CAPSULE    Take 50,000 Units by mouth every 7 (seven) days.    Review of Systems  Constitutional: Negative for fever, chills, appetite change and fatigue.  Respiratory: Negative for chest tightness and shortness of breath.   Cardiovascular: Negative for chest pain and palpitations.  Gastrointestinal: Negative for nausea, vomiting and abdominal pain.  Musculoskeletal:       Right knee pain  Neurological: Negative for dizziness and weakness.    Social History  Substance Use Topics  . Smoking status: Former Smoker -- 1.00 packs/day for 13 years    Types: Cigarettes    Quit date: 09/02/2007  . Smokeless  tobacco: Not on file  . Alcohol Use: No   Objective:   BP 100/70 mmHg  Pulse 83  Temp(Src) 98.5 F (36.9 C) (Oral)  Resp 16  Ht  (1.676 m)  Wt 161 lb (73.029 kg)  BMI 26.00 kg/m2  SpO2 98%  Physical Exam   General Appearance:    Alert, cooperative, no distress, appears stated age  Head:    Normocephalic, without obvious abnormality, atraumatic  Eyes:    PERRL, conjunctiva/corneas clear, EOM's intact, fundi    benign, both eyes  Ears:    Normal TM's and external ear canals, both ears  Nose:   Nares normal, septum midline, mucosa normal, no drainage    or sinus tenderness  Throat:   Lips, mucosa, and tongue normal; teeth and gums normal  Neck:   Supple, symmetrical, trachea midline, no adenopathy;    thyroid:  no enlargement/tenderness/nodules; no carotid   bruit or JVD  Back:     Symmetric, no curvature, ROM normal, no CVA tenderness  Lungs:     Clear to auscultation bilaterally, respirations unlabored   Heart:    Regular rate and rhythm, S1 and S2 normal, no murmur, rub   or gallop  Abdomen:     Soft, non-tender, bowel sounds active all four quadrants,    no masses, no organomegaly  Extremities:   Extremities normal, atraumatic, no cyanosis or edema  Pulses:   2+ and symmetric all extremities  Skin:   Skin color, texture, turgor normal, no rashes or lesions  Lymph nodes:   Cervical, supraclavicular, and axillary nodes normal  Neurologic:   CNII-XII intact, normal strength, sensation and reflexes    throughout       Assessment & Plan:     1. Seizure disorder (HCC) Continue follow up with Dr. Malvin Ferguson. She will need to get surgical clearance from him.  - Comprehensive metabolic panel - CBC  2. Knee pain, unspecified laterality  - traMADol (ULTRAM) 50 MG tablet; Take 1 tablet (50 mg total) by mouth every 6 (six) hours as needed.  Dispense: 30 tablet; Refill: 0  3. Pre-op evaluation Low risk for cardiac and anesthetic complications. Requires pre-op clearance by  her neurologist, is otherwise medically cleared for surgery and general anesthesia.       Wendy Merryonald Judeth Gilles, MD  [facility]

## 2015-07-30 LAB — COMPREHENSIVE METABOLIC PANEL
A/G RATIO: 1.9 (ref 1.1–2.5)
ALK PHOS: 51 IU/L (ref 39–117)
ALT: 32 IU/L (ref 0–32)
AST: 15 IU/L (ref 0–40)
Albumin: 4.4 g/dL (ref 3.5–5.5)
BILIRUBIN TOTAL: 0.3 mg/dL (ref 0.0–1.2)
BUN / CREAT RATIO: 12 (ref 8–20)
BUN: 8 mg/dL (ref 6–20)
CO2: 26 mmol/L (ref 18–29)
Calcium: 9.3 mg/dL (ref 8.7–10.2)
Chloride: 101 mmol/L (ref 97–106)
Creatinine, Ser: 0.67 mg/dL (ref 0.57–1.00)
GFR calc Af Amer: 133 mL/min/{1.73_m2} (ref >59)
GFR calc non Af Amer: 115 mL/min/{1.73_m2} (ref >59)
GLOBULIN, TOTAL: 2.3 g/dL (ref 1.5–4.5)
Glucose: 89 mg/dL (ref 65–99)
POTASSIUM: 4.9 mmol/L (ref 3.5–5.2)
SODIUM: 143 mmol/L (ref 136–144)
Total Protein: 6.7 g/dL (ref 6.0–8.5)

## 2015-07-30 LAB — CBC
Hematocrit: 40.4 % (ref 34.0–46.6)
Hemoglobin: 13.8 g/dL (ref 11.1–15.9)
MCH: 31.9 pg (ref 26.6–33.0)
MCHC: 34.2 g/dL (ref 31.5–35.7)
MCV: 94 fL (ref 79–97)
PLATELETS: 273 10*3/uL (ref 150–379)
RBC: 4.32 x10E6/uL (ref 3.77–5.28)
RDW: 13.7 % (ref 12.3–15.4)
WBC: 5 10*3/uL (ref 3.4–10.8)

## 2015-08-03 ENCOUNTER — Other Ambulatory Visit: Payer: Self-pay | Admitting: Family Medicine

## 2015-08-03 ENCOUNTER — Encounter: Payer: Self-pay | Admitting: Family Medicine

## 2015-08-03 NOTE — Telephone Encounter (Signed)
Pt contacted office for refill request on the following medications: phenytoin (DILANTIN) 100 MG ER capsule.  Wendy SouthAsher Ferguson.  CB#[phone]/MW

## 2015-08-03 NOTE — Telephone Encounter (Signed)
Rx request already sent to provider.  

## 2015-08-04 NOTE — Telephone Encounter (Signed)
Please advise patient she needs to contact Dr. Malvin JohnsPotter to get Dilantin filled.thanks.

## 2015-08-04 NOTE — Telephone Encounter (Signed)
Pt advised/MW °

## 2015-08-10 ENCOUNTER — Ambulatory Visit: Admission: RE | Admit: 2015-08-10 | Payer: Medicaid Other | Source: Ambulatory Visit

## 2015-08-10 ENCOUNTER — Encounter
Admission: RE | Admit: 2015-08-10 | Discharge: 2015-08-10 | Disposition: A | Discharge status: Home / Self Care | Payer: Medicaid Other | Source: Ambulatory Visit | Attending: Orthopedic Surgery | Admitting: Orthopedic Surgery

## 2015-08-10 DIAGNOSIS — Z01812 Encounter for preprocedural laboratory examination: Secondary | ICD-10-CM | POA: Insufficient documentation

## 2015-08-10 DIAGNOSIS — Z87891 Personal history of nicotine dependence: Secondary | ICD-10-CM

## 2015-08-10 HISTORY — DX: Adverse effect of unspecified anesthetic, initial encounter: T41.45XA

## 2015-08-10 LAB — BASIC METABOLIC PANEL
Anion gap: 7 (ref 5–15)
BUN: 11 mg/dL (ref 6–20)
CALCIUM: 8.7 mg/dL — AB (ref 8.9–10.3)
CHLORIDE: 107 mmol/L (ref 101–111)
CO2: 24 mmol/L (ref 22–32)
CREATININE: 0.5 mg/dL (ref 0.44–1.00)
GFR calc non Af Amer: >60 mL/min (ref >60)
Glucose, Bld: 85 mg/dL (ref 65–99)
Potassium: 3.8 mmol/L (ref 3.5–5.1)
SODIUM: 138 mmol/L (ref 135–145)

## 2015-08-10 LAB — CBC
HCT: 41.1 % (ref 35.0–47.0)
HEMOGLOBIN: 13.3 g/dL (ref 12.0–16.0)
MCH: 31.1 pg (ref 26.0–34.0)
MCHC: 32.3 g/dL (ref 32.0–36.0)
MCV: 96.2 fL (ref 80.0–100.0)
Platelets: 225 10*3/uL (ref 150–440)
RBC: 4.27 MIL/uL (ref 3.80–5.20)
RDW: 13.3 % (ref 11.5–14.5)
WBC: 6.6 10*3/uL (ref 3.6–11.0)

## 2015-08-10 LAB — URINALYSIS COMPLETE WITH MICROSCOPIC (ARMC ONLY)
Bilirubin Urine: NEGATIVE
Glucose, UA: NEGATIVE mg/dL
Hgb urine dipstick: NEGATIVE
KETONES UR: NEGATIVE mg/dL
Leukocytes, UA: NEGATIVE
NITRITE: NEGATIVE
PH: 5 (ref 5.0–8.0)
PROTEIN: NEGATIVE mg/dL
RBC / HPF: NONE SEEN RBC/hpf (ref 0–5)
SPECIFIC GRAVITY, URINE: 1.006 (ref 1.005–1.030)

## 2015-08-10 LAB — PROTIME-INR
INR: 0.9
PROTHROMBIN TIME: 12.4 s (ref 11.4–15.0)

## 2015-08-10 LAB — APTT: aPTT: 34 seconds (ref 24–36)

## 2015-08-10 LAB — SEDIMENTATION RATE: Sed Rate: 1 mm/hr (ref 0–20)

## 2015-08-10 NOTE — Pre-Procedure Instructions (Signed)
Pt was scheduled for a PAT visit at 3:00 pm and a 4:00 pm appt with Dr. Martha ClanKrasinski.  We were able to do the PAT visit, lab work, EKG, but were unable to sent the patient to radiology for the CXR before the 4;00 pm appt at Dr. Samuel GermanyKrasinski's office.  Dr. Martha ClanKrasinski canceled the CXR since she has transportation issues and can not come back for another visit for the CXR.

## 2015-08-10 NOTE — Patient Instructions (Signed)
  Your procedure is scheduled on: Wednesday Dec. 28, 2016 arrive at 11:30am. Report to Same Day Surgery.   Remember: Instructions that are not followed completely may result in serious medical risk, up to and including death, or upon the discretion of your surgeon and anesthesiologist your surgery may need to be rescheduled.    _x___ 1. Do not eat food or drink liquids after midnight. No gum chewing or hard candies.     ____ 2. No Alcohol for 24 hours before or after surgery.   ____ 3. Bring all medications with you on the day of surgery if instructed.    __x__ 4. Notify your doctor if there is any change in your medical condition     (cold, fever, infections).     Do not wear jewelry, make-up, hairpins, clips or nail polish.  Do not wear lotions, powders, or perfumes. You may wear deodorant.  Do not shave 48 hours prior to surgery. Men may shave face and neck.  Do not bring valuables to the hospital.    [facility] is not responsible for any belongings or valuables.               Contacts, dentures or bridgework may not be worn into surgery.  Leave your suitcase in the car. After surgery it may be brought to your room.  For patients admitted to the hospital, discharge time is determined by your treatment team.   Patients discharged the day of surgery will not be allowed to drive home.    Please read over the following fact sheets that you were given:   [facility] Preparing for Surgery  __x_ Take these medicines the morning of surgery with A SIP OF WATER:    1. divalproex (DEPAKOTE)  2. phenytoin (DILANTIN)  3. traMADol (ULTRAM) if needed.   ____ Fleet Enema (as directed)   _x__ Use CHG Soap as directed  ____ Use inhalers on the day of surgery  ____ Stop metformin 2 days prior to surgery    ____ Take 1/2 of usual insulin dose the night before surgery and none on the morning of surgery.   ____ Stop Coumadin/Plavix/aspirin on does not apply.  __x_ Stop  Anti-inflammatories on does not apply.  OK to take tramadol for pain.   __x_ Stop supplements  Vitamin D until after surgery.  ____ Bring C-Pap to the hospital.

## 2015-08-19 ENCOUNTER — Ambulatory Visit: Payer: Medicaid Other | Admitting: Anesthesiology

## 2015-08-19 ENCOUNTER — Encounter
Admission: RE | Disposition: A | Discharge status: Home / Self Care | Payer: Self-pay | Source: Ambulatory Visit | Attending: Orthopedic Surgery

## 2015-08-19 ENCOUNTER — Ambulatory Visit
Admission: RE | Admit: 2015-08-19 | Discharge: 2015-08-19 | Disposition: A | Discharge status: Home / Self Care | Payer: Medicaid Other | Source: Ambulatory Visit | Attending: Orthopedic Surgery | Admitting: Orthopedic Surgery

## 2015-08-19 DIAGNOSIS — Z823 Family history of stroke: Secondary | ICD-10-CM | POA: Diagnosis not present

## 2015-08-19 DIAGNOSIS — M7989 Other specified soft tissue disorders: Secondary | ICD-10-CM | POA: Insufficient documentation

## 2015-08-19 DIAGNOSIS — R569 Unspecified convulsions: Secondary | ICD-10-CM | POA: Insufficient documentation

## 2015-08-19 DIAGNOSIS — Z881 Allergy status to other antibiotic agents status: Secondary | ICD-10-CM | POA: Diagnosis not present

## 2015-08-19 DIAGNOSIS — Z841 Family history of disorders of kidney and ureter: Secondary | ICD-10-CM | POA: Insufficient documentation

## 2015-08-19 DIAGNOSIS — M659 Synovitis and tenosynovitis, unspecified: Secondary | ICD-10-CM | POA: Diagnosis present

## 2015-08-19 DIAGNOSIS — Z809 Family history of malignant neoplasm, unspecified: Secondary | ICD-10-CM | POA: Insufficient documentation

## 2015-08-19 DIAGNOSIS — Z90710 Acquired absence of both cervix and uterus: Secondary | ICD-10-CM | POA: Diagnosis not present

## 2015-08-19 DIAGNOSIS — S83281A Other tear of lateral meniscus, current injury, right knee, initial encounter: Secondary | ICD-10-CM | POA: Insufficient documentation

## 2015-08-19 DIAGNOSIS — S83511A Sprain of anterior cruciate ligament of right knee, initial encounter: Secondary | ICD-10-CM | POA: Diagnosis not present

## 2015-08-19 DIAGNOSIS — M238X1 Other internal derangements of right knee: Secondary | ICD-10-CM | POA: Insufficient documentation

## 2015-08-19 DIAGNOSIS — Z79899 Other long term (current) drug therapy: Secondary | ICD-10-CM | POA: Insufficient documentation

## 2015-08-19 DIAGNOSIS — Z87891 Personal history of nicotine dependence: Secondary | ICD-10-CM | POA: Diagnosis not present

## 2015-08-19 DIAGNOSIS — Z8261 Family history of arthritis: Secondary | ICD-10-CM | POA: Insufficient documentation

## 2015-08-19 DIAGNOSIS — R51 Headache: Secondary | ICD-10-CM | POA: Diagnosis not present

## 2015-08-19 DIAGNOSIS — X58XXXA Exposure to other specified factors, initial encounter: Secondary | ICD-10-CM | POA: Diagnosis not present

## 2015-08-19 DIAGNOSIS — G4730 Sleep apnea, unspecified: Secondary | ICD-10-CM | POA: Insufficient documentation

## 2015-08-19 DIAGNOSIS — M6751 Plica syndrome, right knee: Secondary | ICD-10-CM | POA: Diagnosis not present

## 2015-08-19 DIAGNOSIS — Z8249 Family history of ischemic heart disease and other diseases of the circulatory system: Secondary | ICD-10-CM | POA: Diagnosis not present

## 2015-08-19 HISTORY — PX: KNEE ARTHROSCOPY: SHX127

## 2015-08-19 SURGERY — ARTHROSCOPY, KNEE
Anesthesia: General | Site: Knee | Laterality: Right | Wound class: Clean

## 2015-08-19 MED ORDER — PHENYLEPHRINE HCL 10 MG/ML IJ SOLN
INTRAMUSCULAR | Status: DC | PRN
  Administered 2015-08-19 (×5): 100 ug via INTRAVENOUS

## 2015-08-19 MED ORDER — BUPIVACAINE-EPINEPHRINE 0.25% -1:200000 IJ SOLN
INTRAMUSCULAR | Status: DC | PRN
  Administered 2015-08-19: 30 mL

## 2015-08-19 MED ORDER — BUPIVACAINE-EPINEPHRINE (PF) 0.25% -1:200000 IJ SOLN
INTRAMUSCULAR | Status: AC
  Filled 2015-08-19: qty 30

## 2015-08-19 MED ORDER — PROPOFOL 10 MG/ML IV BOLUS
INTRAVENOUS | Status: DC | PRN
  Administered 2015-08-19: 150 mg via INTRAVENOUS

## 2015-08-19 MED ORDER — CEFAZOLIN SODIUM-DEXTROSE 2-3 GM-% IV SOLR
2.0000 g | Freq: Once | INTRAVENOUS | Status: AC
  Administered 2015-08-19: 2 g via INTRAVENOUS

## 2015-08-19 MED ORDER — MIDAZOLAM HCL 2 MG/2ML IJ SOLN
INTRAMUSCULAR | Status: DC | PRN
  Administered 2015-08-19: 2 mg via INTRAVENOUS

## 2015-08-19 MED ORDER — OXYCODONE HCL 5 MG PO TABS
5.0000 mg | ORAL_TABLET | ORAL | Status: DC | PRN
  Administered 2015-08-19: 5 mg via ORAL
  Filled 2015-08-19: qty 1

## 2015-08-19 MED ORDER — FENTANYL CITRATE (PF) 100 MCG/2ML IJ SOLN
25.0000 ug | INTRAMUSCULAR | Status: DC | PRN
  Administered 2015-08-19 (×4): 25 ug via INTRAVENOUS

## 2015-08-19 MED ORDER — LIDOCAINE HCL (PF) 1 % IJ SOLN
INTRAMUSCULAR | Status: AC
  Filled 2015-08-19: qty 30

## 2015-08-19 MED ORDER — ONDANSETRON HCL 4 MG/2ML IJ SOLN
INTRAMUSCULAR | Status: DC | PRN
  Administered 2015-08-19: 4 mg via INTRAVENOUS

## 2015-08-19 MED ORDER — ONDANSETRON HCL 4 MG PO TABS: 4.0000 mg | ORAL_TABLET | Freq: Three times a day (TID) | ORAL | Status: DC | PRN

## 2015-08-19 MED ORDER — FAMOTIDINE 20 MG PO TABS
ORAL_TABLET | ORAL | Status: AC
  Administered 2015-08-19: 20 mg via ORAL
  Filled 2015-08-19: qty 1

## 2015-08-19 MED ORDER — FENTANYL CITRATE (PF) 100 MCG/2ML IJ SOLN
INTRAMUSCULAR | Status: AC
  Administered 2015-08-19: 25 ug via INTRAVENOUS
  Filled 2015-08-19: qty 2

## 2015-08-19 MED ORDER — FENTANYL CITRATE (PF) 100 MCG/2ML IJ SOLN
INTRAMUSCULAR | Status: DC | PRN
  Administered 2015-08-19: 100 ug via INTRAVENOUS

## 2015-08-19 MED ORDER — LIDOCAINE HCL 1 % IJ SOLN
INTRAMUSCULAR | Status: DC | PRN
  Administered 2015-08-19: 30 mL

## 2015-08-19 MED ORDER — ONDANSETRON HCL 4 MG/2ML IJ SOLN: 4.0000 mg | Freq: Once | INTRAMUSCULAR | Status: DC | PRN

## 2015-08-19 MED ORDER — HYDROMORPHONE HCL 1 MG/ML IJ SOLN: 0.2500 mg | INTRAMUSCULAR | Status: DC | PRN

## 2015-08-19 MED ORDER — OXYCODONE HCL 5 MG PO TABS
ORAL_TABLET | ORAL | Status: AC
  Filled 2015-08-19: qty 1

## 2015-08-19 MED ORDER — OXYCODONE HCL 5 MG PO TABS: 5.0000 mg | ORAL_TABLET | ORAL | Status: DC | PRN

## 2015-08-19 MED ORDER — FAMOTIDINE 20 MG PO TABS
20.0000 mg | ORAL_TABLET | Freq: Once | ORAL | Status: AC
  Administered 2015-08-19: 20 mg via ORAL

## 2015-08-19 MED ORDER — ASPIRIN EC 325 MG PO TBEC: 325.0000 mg | DELAYED_RELEASE_TABLET | Freq: Every day | ORAL | Status: DC

## 2015-08-19 MED ORDER — CEFAZOLIN SODIUM-DEXTROSE 2-3 GM-% IV SOLR
INTRAVENOUS | Status: DC
Start: 2015-08-19 — End: 2015-08-19
  Filled 2015-08-19: qty 50

## 2015-08-19 MED ORDER — LACTATED RINGERS IV SOLN
INTRAVENOUS | Status: DC
  Administered 2015-08-19 (×2): via INTRAVENOUS

## 2015-08-19 SURGICAL SUPPLY — 31 items
BUR RADIUS 3.5 (BURR) IMPLANT
BUR RADIUS 4.0X18.5 (BURR) IMPLANT
COOLER POLAR GLACIER W/PUMP (MISCELLANEOUS) ×2 IMPLANT
DRAPE IMP U-DRAPE 54X76 (DRAPES) ×2 IMPLANT
DURAPREP 26ML APPLICATOR (WOUND CARE) ×6 IMPLANT
GAUZE PETRO XEROFOAM 1X8 (MISCELLANEOUS) ×2 IMPLANT
GAUZE SPONGE 4X4 12PLY STRL (GAUZE/BANDAGES/DRESSINGS) ×2 IMPLANT
GLOVE BIOGEL PI IND STRL 9 (GLOVE) ×1 IMPLANT
GLOVE BIOGEL PI INDICATOR 9 (GLOVE) ×1
GLOVE SURG 9.0 ORTHO LTXF (GLOVE) ×2 IMPLANT
GOWN STRL REUS W/ TWL LRG LVL3 (GOWN DISPOSABLE) ×1 IMPLANT
GOWN STRL REUS W/TWL 2XL LVL3 (GOWN DISPOSABLE) ×2 IMPLANT
GOWN STRL REUS W/TWL LRG LVL3 (GOWN DISPOSABLE) ×1
IV LACTATED RINGER IRRG 3000ML (IV SOLUTION) ×6
IV LR IRRIG 3000ML ARTHROMATIC (IV SOLUTION) ×6 IMPLANT
KIT RM TURNOVER STRD PROC AR (KITS) ×2 IMPLANT
MANIFOLD NEPTUNE II (INSTRUMENTS) ×2 IMPLANT
PACK ARTHROSCOPY KNEE (MISCELLANEOUS) ×2 IMPLANT
PAD ABD DERMACEA PRESS 5X9 (GAUZE/BANDAGES/DRESSINGS) ×4 IMPLANT
PAD WRAPON POLAR KNEE (MISCELLANEOUS) ×1 IMPLANT
SET TUBE SUCT SHAVER OUTFL 24K (TUBING) ×2 IMPLANT
SOL PREP PVP 2OZ (MISCELLANEOUS) ×2
SOLUTION PREP PVP 2OZ (MISCELLANEOUS) ×1 IMPLANT
STRIP CLOSURE SKIN 1/2X4 (GAUZE/BANDAGES/DRESSINGS) ×2 IMPLANT
SUT ETHILON 4-0 (SUTURE) ×1
SUT ETHILON 4-0 FS2 18XMFL BLK (SUTURE) ×1
SUTURE ETHLN 4-0 FS2 18XMF BLK (SUTURE) ×1 IMPLANT
TUBING ARTHRO INFLOW-ONLY STRL (TUBING) ×2 IMPLANT
WAND HAND CNTRL MULTIVAC 50 (MISCELLANEOUS) IMPLANT
WAND HAND CNTRL MULTIVAC 90 (MISCELLANEOUS) ×2 IMPLANT
WRAPON POLAR PAD KNEE (MISCELLANEOUS) ×2

## 2015-08-19 NOTE — H&P (Signed)
The patient has been re-examined, and the chart reviewed, and there have been no interval changes to the documented history and physical.    The risks, benefits, and alternatives have been discussed at length, and the patient is willing to proceed.   

## 2015-08-19 NOTE — Op Note (Signed)
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  SURGEON:  Thornton Park, MD  ANESTHESIA:   General  PREOPERATIVE INDICATIONS:  Wendy Ferguson  34 y.o. female with a diagnosis of knee stiffness and pain who failed conservative management and elected for surgical management.  An MRI of the right knee and shown a chronically torn anterior cruciate ligament causing a mass effect in the anterior knee and possible adhesions.  The risks benefits and alternatives were discussed with the patient preoperatively including the risks of infection, bleeding, nerve injury, knee stiffness, persistent pain, osteoarthritis and the need for further surgery. Medical  risks include DVT and pulmonary embolism, myocardial infarction, stroke, pneumonia, respiratory failure and death. The patient understood these risks and wished to proceed.   OPERATIVE FINDINGS: Complete tear of the right anterior cruciate ligament with mass effect in the anterior notch, undersurface tear of the lateral meniscus, synovitis and adhesions/scar in Hoffa's fat pad.  There was also a grade I/II chondral wear of the lateral femoral condyle   OPERATIVE PROCEDURE: Patient was met in the preoperative area. The operative extremity was signed with the word yes and my initials according the hospital's correct site of surgery protocol.  The patient was brought to the operating room where she was placed supine on the operative table. General anesthesia was administered. The patient was  prepped and draped in a sterile fashion.    A timeout was performed to verify the patient's name, date of birth, medical record number, correct site of surgery correct procedure to be performed. It was also used to verify the patient had had received antibiotics that all appropriate instruments, and radiographic studies were available in the room. Once all in attendance were in agreement, the case began.  Proposed arthroscopy incisions were drawn out with a surgical marker. Her previous arthroscopy incisions were used again for this case. These were pre-injected with 1% lidocaine plain. An 11 blade was used to establish an inferior lateral and inferomedial portals. The inferomedial portal was created using a 18-gauge spinal needle under direct visualization.  A full diagnostic examination of the knee was performed including the suprapatellar pouch, patellofemoral joint, medial lateral compartments as well as the medial lateral gutters, the intercondylar notch in the posterior knee..  Patient was found to have extensive scar tissue within the Hoffa's fat pad. Lysis of adhesions and scar from Hoffa's fat pad and a partial synovectomy were performed. Synovectomies of the medial and lateral gutters were also performed. A suprapatellar plica was also debrided using the 90 ArthroCare wand. The torn anterior cruciate ligament was debrided with a 4.0 mm resector shaver blade and 90 ArthroCare wand. All bleeding vessels were cauterized with the ArthroCare wand.  The lateral meniscal tear was treated with the knee in a figure 4 position with a 4.21m resector shaver blade and straight duckbill basket. The meniscus tear was debrided until a stable rim was achieved.   The knee was then copiously lavaged. All arthroscopic incisions removed. The 2 arthroscopy portals were closed with 4-0 nylon. Steri-Strips were applied along with a dry sterile and  compressive dressing. The patient was brought to the PACU in stable  condition. I scrubbed and present the entire case and all sharp and instrument counts were correct at the conclusion the case. I spoke to the patient's family member postoperatively to let her know the case it done without complication and the patient was stable in the recovery room.   Timoteo Gaul, MD

## 2015-08-19 NOTE — Anesthesia Preprocedure Evaluation (Signed)
Anesthesia Evaluation  Patient identified by MRN, date of birth, ID band Patient awake    Reviewed: Allergy & Precautions, H&P , NPO status , Patient's Chart, lab work & pertinent test results, reviewed documented beta blocker date and time   History of Anesthesia Complications (+) PONV and history of anesthetic complications  Airway Mallampati: II  TM Distance: >3 FB Neck ROM: full    Dental  (+) Teeth Intact   Pulmonary neg pulmonary ROS, former smoker,    Pulmonary exam normal        Cardiovascular Exercise Tolerance: Good negative cardio ROS Normal cardiovascular exam Rate:Normal     Neuro/Psych  Headaches, Seizures -,  negative neurological ROS  negative psych ROS   GI/Hepatic negative GI ROS, Neg liver ROS,   Endo/Other  negative endocrine ROS  Renal/GU negative Renal ROS  negative genitourinary   Musculoskeletal   Abdominal   Peds  Hematology negative hematology ROS (+)   Anesthesia Other Findings   Reproductive/Obstetrics negative OB ROS                             Anesthesia Physical Anesthesia Plan  ASA: II  Anesthesia Plan: General LMA   Post-op Pain Management:    Induction:   Airway Management Planned:   Additional Equipment:   Intra-op Plan:   Post-operative Plan:   Informed Consent: I have reviewed the patients History and Physical, chart, labs and discussed the procedure including the risks, benefits and alternatives for the proposed anesthesia with the patient or authorized representative who has indicated his/her understanding and acceptance.     Plan Discussed with: CRNA  Anesthesia Plan Comments:         Anesthesia Quick Evaluation

## 2015-08-19 NOTE — Anesthesia Procedure Notes (Signed)
Procedure Name: LMA Insertion Date/Time: 08/19/2015 1:05 PM Performed by: Junious SilkNOLES, Kaylen Nghiem Pre-anesthesia Checklist: Patient identified, Patient being monitored, Timeout performed, Emergency Drugs available and Suction available Patient Re-evaluated:Patient Re-evaluated prior to inductionOxygen Delivery Method: Circle system utilized Preoxygenation: Pre-oxygenation with 100% oxygen Intubation Type: IV induction Ventilation: Mask ventilation without difficulty LMA: LMA inserted LMA Size: 3.5 Tube type: Oral Number of attempts: 1 Placement Confirmation: positive ETCO2 and breath sounds checked- equal and bilateral Tube secured with: Tape Dental Injury: Teeth and Oropharynx as per pre-operative assessment

## 2015-08-19 NOTE — Progress Notes (Signed)
Polar care to right knee. 

## 2015-08-19 NOTE — Transfer of Care (Signed)
Immediate Anesthesia Transfer of Care Note  Patient: Wendy Ferguson  Procedure(s) Performed: Procedure(s): ARTHROSCOPY KNEE, LYSIS OF ADHESIONS AND PARTIAL SYNOVECTOMY (Right)  Patient Location: PACU  Anesthesia Type:General  Level of Consciousness: awake and sedated  Airway & Oxygen Therapy: Patient Spontanous Breathing and Patient connected to face mask oxygen  Post-op Assessment: Report given to RN and Post -op Vital signs reviewed and stable  Post vital signs: Reviewed and stable  Last Vitals:  Filed Vitals:   08/19/15 1145  BP: 109/63  Pulse: 81  Temp: 36.9 C  Resp: 16    Complications: No apparent anesthesia complications

## 2015-08-19 NOTE — Discharge Instructions (Addendum)
Patient will use crutches for the next few days until they is able to walk without a limp. Patient may weight-bear as tolerated after 2-3 days. Patiient should elevate and ice his leg whenever possible over the next 3 days. Patient should keep the surgical dressing covered during showers for 3 days with either a plastic bag or Saran wrap. The bandage may be removed after 3 days. Leave Steri-Strips in place. Take enteric-coated aspirin 325 mg by mouth daily 6 weeks postop for DVT prophylaxis.  Follow up with Dr. Martha ClanKrasinski in 7-10 days in the office. AMBULATORY SURGERY  DISCHARGE INSTRUCTIONS   1) The drugs that you were given will stay in your system until tomorrow so for the next 24 hours you should not:  A) Drive an automobile B) Make any legal decisions C) Drink any alcoholic beverage   2) You may resume regular meals tomorrow.  Today it is better to start with liquids and gradually work up to solid foods.  You may eat anything you prefer, but it is better to start with liquids, then soup and crackers, and gradually work up to solid foods.   3) Please notify your doctor immediately if you have any unusual bleeding, trouble breathing, redness and pain at the surgery site, drainage, fever, or pain not relieved by medication.    4) Additional Instructions:        Please contact your physician with any problems or Same Day Surgery at [phone], Monday through Friday 6 am to 4 pm, or [place] at [facility] Main number at [phone].

## 2015-08-20 NOTE — Anesthesia Postprocedure Evaluation (Signed)
Anesthesia Post Note  Patient: Amarachi E Fergusson  Procedure(s) Performed: Procedure(s) (LRB): ARTHROSCOPY KNEE, LYSIS OF ADHESIONS AND PARTIAL SYNOVECTOMY (Right)  Patient location during evaluation: PACU Anesthesia Type: General Level of consciousness: awake and alert Pain management: pain level controlled Vital Signs Assessment: post-procedure vital signs reviewed and stable Respiratory status: spontaneous breathing, nonlabored ventilation, respiratory function stable and patient connected to nasal cannula oxygen Cardiovascular status: blood pressure returned to baseline and stable Postop Assessment: no signs of nausea or vomiting Anesthetic complications: no    Last Vitals:  Filed Vitals:   08/19/15 1549 08/19/15 1615  BP: 100/53 103/54  Pulse: 89 85  Temp:    Resp: 16 16    Last Pain:  Filed Vitals:   08/20/15 0813  PainSc: 6                  Yevette EdwardsJames G Adams

## 2015-09-08 ENCOUNTER — Ambulatory Visit (INDEPENDENT_AMBULATORY_CARE_PROVIDER_SITE_OTHER): Payer: Medicaid Other | Admitting: Family Medicine

## 2015-09-08 ENCOUNTER — Encounter: Payer: Self-pay | Admitting: Family Medicine

## 2015-09-08 VITALS — BP 100/64 | HR 82 | Temp 99.2°F | Resp 16 | Ht 66.0 in | Wt 160.0 lb

## 2015-09-08 DIAGNOSIS — G40909 Epilepsy, unspecified, not intractable, without status epilepticus: Secondary | ICD-10-CM

## 2015-09-08 DIAGNOSIS — R21 Rash and other nonspecific skin eruption: Secondary | ICD-10-CM

## 2015-09-08 DIAGNOSIS — M25569 Pain in unspecified knee: Secondary | ICD-10-CM | POA: Diagnosis not present

## 2015-09-08 MED ORDER — DIVALPROEX SODIUM 500 MG PO DR TAB: 500.0000 mg | DELAYED_RELEASE_TABLET | Freq: Three times a day (TID) | ORAL | Status: DC

## 2015-09-08 MED ORDER — CLOTRIMAZOLE-BETAMETHASONE 1-0.05 % EX CREA: 1.0000 "application " | TOPICAL_CREAM | Freq: Two times a day (BID) | CUTANEOUS | Status: DC

## 2015-09-08 MED ORDER — TRAMADOL HCL 50 MG PO TABS: 50.0000 mg | ORAL_TABLET | Freq: Three times a day (TID) | ORAL | Status: DC | PRN

## 2015-09-08 NOTE — Progress Notes (Signed)
Patient: Wendy Ferguson Female    DOB: [birth year]   35 y.o.   MRN: [id number] Visit Date: 09/08/2015  Today's Provider: [name], MD   Chief Complaint  Patient presents with  . Rash   Subjective:    Rash This is a new problem. The current episode started 1 to 4 weeks ago (over a month ago). The problem is unchanged. The affected locations include the right arm (right upper arm). The rash is characterized by redness. She was exposed to nothing. Pertinent negatives include no anorexia, congestion, cough, diarrhea, eye pain, facial edema, fatigue, fever, joint pain, nail changes, rhinorrhea, shortness of breath, sore throat or vomiting. Past treatments include antihistamine (cortisone 10). The treatment provided no relief.    Noticed two red spots on upper right arm 08/18/2015. Spots do not itch or burn. Spots will go away by then end of the day and reappear by morning.   She had knee surgery about 3 weeks ago and is still having some pain. She was prescribed oxycodone for pain, but would like to change to tramadol.     Allergies  Allergen Reactions  . Erythromycin   . Tylenol  [Acetaminophen]   . Adhesive [Tape] Rash    With certain bandaids cause a rash   Previous Medications   ASPIRIN EC 325 MG TABLET    Take 1 tablet (325 mg total) by mouth daily.   DIVALPROEX (DEPAKOTE) 500 MG DR TABLET    Take 500 mg by mouth 3 (three) times daily.   FOLIC ACID (FOLVITE) 1 MG TABLET    Take 1 mg by mouth daily.   ONDANSETRON (ZOFRAN) 4 MG TABLET    Take 1 tablet (4 mg total) by mouth every 8 (eight) hours as needed for nausea or vomiting.   OXYCODONE (OXY IR/ROXICODONE) 5 MG IMMEDIATE RELEASE TABLET    Take 1 tablet (5 mg total) by mouth every 4 (four) hours as needed for severe pain.   PHENYTOIN (DILANTIN) 100 MG ER CAPSULE    Take 2 capsules by mouth. every morning , then take 2 capsules at night   VITAMIN D, ERGOCALCIFEROL, (DRISDOL) 50000 UNITS CAPS CAPSULE    Take 50,000  Units by mouth every 7 (seven) days. On Tuesdays in am.    Review of Systems  Constitutional: Negative for fever, chills, appetite change and fatigue.  HENT: Negative for congestion, rhinorrhea and sore throat.   Eyes: Negative for pain.  Respiratory: Negative for cough, chest tightness and shortness of breath.   Cardiovascular: Negative for chest pain and palpitations.  Gastrointestinal: Negative for nausea, vomiting, abdominal pain, diarrhea and anorexia.  Musculoskeletal: Negative for joint pain.  Skin: Positive for rash. Negative for nail changes.  Neurological: Negative for dizziness and weakness.    Social History  Substance Use Topics  . Smoking status: Former Smoker -- 1.00 packs/day for 13 years    Types: Cigarettes    Quit date: 09/02/2007  . Smokeless tobacco: Not on file  . Alcohol Use: No   Objective:   BP 100/64 mmHg  Pulse 82  Temp(Src) 99.2 F (37.3 C) (Oral)  Resp 16  Ht  (1.676 m)  Wt 160 lb (72.576 kg)  BMI 25.84 kg/m2  SpO2 97%  Physical Exam  General appearance: alert, well developed, well nourished, cooperative and in no distress Head: Normocephalic, without obvious abnormality, atraumatic Lungs: Respirations even and unlabored Extremities: No gross deformities Skin: Two round red blanching macular lesion about  1cm in diameter, well circumscribed left upper arm. Non tender. No surrounding lesions.  Psych: Appropriate mood and affect. Neurologic: Mental status: Alert, oriented to person, place, and time, thought content appropriate.     Assessment & Plan:     1. Rash Suspect fungal lesion.  - clotrimazole-betamethasone (LOTRISONE) cream; Apply 1 application topically 2 (two) times daily.  Dispense: 30 g; Refill: 0 Call for referral to dermatology if not resolved in 2 weeks  2. Knee pain, right 3 weeks post arthroscopy and improving. Change pain medication to Tramadol - traMADol (ULTRAM) 50 MG tablet; Take 1 tablet (50 mg total) by mouth  every 8 (eight) hours as needed.  Dispense: 30 tablet; Refill: 0  3. Seizure disorder (HCC) Well controlled.  Needs refill depakote.  - divalproex (DEPAKOTE) 500 MG DR tablet; Take 1 tablet (500 mg total) by mouth 3 (three) times daily.  Dispense: 90 tablet; Refill: 12       [name], MD  [facility]

## 2015-09-16 DIAGNOSIS — R9089 Other abnormal findings on diagnostic imaging of central nervous system: Secondary | ICD-10-CM | POA: Insufficient documentation

## 2015-11-18 ENCOUNTER — Telehealth: Payer: Self-pay | Admitting: Family Medicine

## 2015-11-18 NOTE — Telephone Encounter (Signed)
Pt received jury summons and would like you to write a note saying she is to be excused from her jury duty due to her seizures.  Her call back number is [phone]  Thanks Barth Kirkseri

## 2015-11-18 NOTE — Telephone Encounter (Signed)
Please advise 

## 2015-11-19 NOTE — Telephone Encounter (Signed)
Letter has been printed and is ready to pick up.

## 2015-11-19 NOTE — Telephone Encounter (Signed)
Patient is calling to follow up on the message from yesterday regarding a letter to excuse her from jury duty. Please call [phone].

## 2015-11-20 NOTE — Telephone Encounter (Signed)
Patient was notified.

## 2016-02-01 ENCOUNTER — Emergency Department
Admission: EM | Admit: 2016-02-01 | Discharge: 2016-02-01 | Disposition: A | Discharge status: Home / Self Care | Payer: Medicaid Other | Attending: Emergency Medicine | Admitting: Emergency Medicine

## 2016-02-01 ENCOUNTER — Emergency Department: Payer: Medicaid Other

## 2016-02-01 ENCOUNTER — Encounter: Payer: Self-pay | Admitting: Emergency Medicine

## 2016-02-01 DIAGNOSIS — Z7982 Long term (current) use of aspirin: Secondary | ICD-10-CM | POA: Diagnosis not present

## 2016-02-01 DIAGNOSIS — Z8669 Personal history of other diseases of the nervous system and sense organs: Secondary | ICD-10-CM | POA: Diagnosis not present

## 2016-02-01 DIAGNOSIS — R079 Chest pain, unspecified: Secondary | ICD-10-CM | POA: Diagnosis present

## 2016-02-01 DIAGNOSIS — Z79899 Other long term (current) drug therapy: Secondary | ICD-10-CM | POA: Diagnosis not present

## 2016-02-01 DIAGNOSIS — Z87891 Personal history of nicotine dependence: Secondary | ICD-10-CM | POA: Diagnosis not present

## 2016-02-01 LAB — CBC
HEMATOCRIT: 40.5 % (ref 35.0–47.0)
Hemoglobin: 13.7 g/dL (ref 12.0–16.0)
MCH: 31.7 pg (ref 26.0–34.0)
MCHC: 33.7 g/dL (ref 32.0–36.0)
MCV: 93.9 fL (ref 80.0–100.0)
PLATELETS: 104 10*3/uL — AB (ref 150–440)
RBC: 4.31 MIL/uL (ref 3.80–5.20)
RDW: 13.6 % (ref 11.5–14.5)
WBC: 7.4 10*3/uL (ref 3.6–11.0)

## 2016-02-01 LAB — BASIC METABOLIC PANEL
Anion gap: 9 (ref 5–15)
BUN: 18 mg/dL (ref 6–20)
CHLORIDE: 105 mmol/L (ref 101–111)
CO2: 22 mmol/L (ref 22–32)
CREATININE: 0.77 mg/dL (ref 0.44–1.00)
Calcium: 9.1 mg/dL (ref 8.9–10.3)
GFR calc non Af Amer: >60 mL/min (ref >60)
Glucose, Bld: 91 mg/dL (ref 65–99)
POTASSIUM: 3.9 mmol/L (ref 3.5–5.1)
SODIUM: 136 mmol/L (ref 135–145)

## 2016-02-01 LAB — FIBRIN DERIVATIVES D-DIMER (ARMC ONLY): Fibrin derivatives D-dimer (ARMC): 118 (ref 0–499)

## 2016-02-01 LAB — TROPONIN I: Troponin I: <0.03 ng/mL (ref <0.031)

## 2016-02-01 MED ORDER — TRAMADOL HCL 50 MG PO TABS: 50.0000 mg | ORAL_TABLET | Freq: Four times a day (QID) | ORAL | Status: DC | PRN

## 2016-02-01 NOTE — ED Provider Notes (Signed)
[facility] Emergency Department Provider Note  Time seen: 10:34 PM  I have reviewed the triage vital signs and the nursing notes.   HISTORY  Chief Complaint Chest Pain    HPI Wendy Ferguson is a 35 y.o. female with a past medical history of ovarian cysts, gastritis, seizure disorder, who presents the emergency department for chest pain. According to the patient she was stung on the foot pain insect last night around 7 PM. She states immediately after she began experiencing 10 out of 10 sharp chest pain. States the pain is worse with cough, inspiration or movement. Denies any trouble breathing. Denies any fever. Does state she has had a recent cough as well. Patient does not know what insect bit her she believes it could be a fire ant, but did not see it happen. Currently the patient states moderate chest pain, located in the center of her chest. Describes as a sharp pain.     Past Medical History  Diagnosis Date  . Ovarian cyst   . Helicobacter pylori gastritis   . Complication of anesthesia 09/2012    pt had a seizure during partial hysterectomy surgery   . Seizures [facility])     Patient Active Problem List   Diagnosis Date Noted  . Excessive sleepiness 07/29/2015  . Fatigue 07/29/2015  . Frequent headaches 07/29/2015  . Hand numbness 07/29/2015  . Left ovarian cyst 07/29/2015  . Vitamin D deficiency 07/29/2015  . Headache disorder 04/21/2015  . Knee pain, right 03/20/2015  . Abnormal involuntary movement 01/07/2010  . Acne 01/07/2010  . Arthralgia of lower leg 01/07/2010  . Seizure disorder (HCC) 08/20/1999  . 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 Knee surgery Right 08/22/17  . Knee surgery Right 1989    Fell while having seizer.   Marland Kitchen Abscess of ovary  2009    removed scar tissue  . Cesarean section    . Abdominal hysterectomy    . Brain surgery      to determine the cause of  seizures.  plate put in head right forehead.  . Knee arthroscopy Right 08/19/2015    Procedure: ARTHROSCOPY KNEE, LYSIS OF ADHESIONS AND PARTIAL SYNOVECTOMY;  Surgeon: Juanell Fairly, MD;  Location: [facility] ORS;  Service: Orthopedics;  Laterality: Right;    Current Outpatient Rx  Name  Route  Sig  Dispense  Refill  . aspirin EC 325 MG tablet   Oral   Take 1 tablet (325 mg total) by mouth daily.   45 tablet   0   . clotrimazole-betamethasone (LOTRISONE) cream   Topical   Apply 1 application topically 2 (two) times daily.   30 g   0   . divalproex (DEPAKOTE) 500 MG DR tablet   Oral   Take 1 tablet (500 mg total) by mouth 3 (three) times daily.   90 tablet   12   . folic acid (FOLVITE) 1 MG tablet   Oral   Take 1 mg by mouth daily.         . ondansetron (ZOFRAN) 4 MG tablet   Oral   Take 1 tablet (4 mg total) by mouth every 8 (eight) hours as needed for nausea or vomiting. Patient not taking: Reported on 09/08/2015   30 tablet   0   . phenytoin (DILANTIN) 100 MG ER capsule   Oral   Take 2 capsules by mouth. every morning , then take 2  capsules at night         . traMADol (ULTRAM) 50 MG tablet   Oral   Take 1 tablet (50 mg total) by mouth every 8 (eight) hours as needed.   30 tablet   0   . Vitamin D, Ergocalciferol, (DRISDOL) 50000 UNITS CAPS capsule   Oral   Take 50,000 Units by mouth every 7 (seven) days. On Tuesdays in am.           Allergies Erythromycin; Tylenol ; and Adhesive  History reviewed. No pertinent family history.  Social History Social History  Substance Use Topics  . Smoking status: Former Smoker -- 1.00 packs/day for 13 years    Types: Cigarettes    Quit date: 09/02/2007  . Smokeless tobacco: None  . Alcohol Use: No    Review of Systems Constitutional: Negative for fever. Cardiovascular: Positive for central sharp chest pain. Respiratory: Negative for shortness of breath. Gastrointestinal: Negative for abdominal  pain Musculoskeletal: Negative for back pain. Neurological: Negative for headache 10-point ROS otherwise negative.  ____________________________________________   PHYSICAL EXAM:  VITAL SIGNS: ED Triage Vitals  Enc Vitals Group     BP 02/01/16 1945 103/70 mmHg     Pulse Rate 02/01/16 1945 96     Resp 02/01/16 1945 18     Temp 02/01/16 1945 98.5 F (36.9 C)     Temp Source 02/01/16 1945 Oral     SpO2 02/01/16 1945 97 %     Weight 02/01/16 1945 174 lb (78.926 kg)     Height 02/01/16 1945  (1.676 m)     Head Cir --      Peak Flow --      Pain Score --      Pain Loc --      Pain Edu? --      Excl. in GC? --     Constitutional: Alert and oriented. Well appearing and in no distress. Eyes: Normal exam ENT   Head: Normocephalic and atraumatic   Mouth/Throat: Mucous membranes are moist. Cardiovascular: Normal rate, regular rhythm. No murmur Respiratory: Normal respiratory effort without tachypnea nor retractions. Breath sounds are clear Gastrointestinal: Soft and nontender. No distention.  Musculoskeletal: Nontender with normal range of motion in all extremities. No lower extremity tenderness or edema. Neurologic:  Normal speech and language. No gross focal neurologic deficits  Skin:  Skin is warm, dry and intact.  Psychiatric: Mood and affect are normal. Speech and behavior are normal.  ____________________________________________    EKG  EKG reviewed and interpreted by myself shows normal sinus rhythm at 89 bpm, narrow QRS, normal axis, normal intervals, nonspecific ST changes.  ____________________________________________    RADIOLOGY  Chest x-ray negative  INITIAL IMPRESSION / ASSESSMENT AND PLAN / ED COURSE  Pertinent labs & imaging results that were available during my care of the patient were reviewed by me and considered in my medical decision making (see chart for details).  The patient presents the emergency department with intermittent chest  pain since last night after being stung by an insect on her foot. Foot appears normal, nontender, no swelling. Labs are within normal limits including negative troponin. Chest x-ray is negative, EKG shows no concerning findings. Overall the patient appears well, she does state the pain is somewhat worse with deep inspiration, she is mildly tachycardic around 95 bpm. No lower extremity edema or tenderness. No history of DVT in the past. Patient denies any estrogen or birth control use. However given the pleuritic nature  of the chest pain have added on a d-dimer to help rule out DVT/PE.  D-dimer is negative. Patient will be discharged home with PCP follow-up.  ____________________________________________   FINAL CLINICAL IMPRESSION(S) / ED DIAGNOSES  Chest pain   Minna AntisKevin Mariette Cowley, MD 02/01/16 2303

## 2016-02-01 NOTE — ED Notes (Signed)
Pt presents to ED c/o chest pain that started last night. Pain is central and with exertion, sharp intermittent. 10/10 pain

## 2016-02-01 NOTE — ED Notes (Signed)
Lab called back stating they DO NOT have a blue top on this patient to run D-Dimer.

## 2016-02-01 NOTE — ED Notes (Signed)
Pt provided meal tray, ok by Dr. Lenard LancePaduchowski.

## 2016-02-01 NOTE — ED Notes (Signed)
Pt stating that ride fell through, calling [employer] now.

## 2016-02-01 NOTE — ED Notes (Signed)
Patient ambulated to restroom with NAD noted

## 2016-02-01 NOTE — ED Notes (Signed)
Patient reports being outside earlier today and believes a fireant stung her L foot toe. PAtient states after being stung she has has fever, sharp chest pain intermittently, and nausea. Patient denies taking anything for fever at home and did not have fever upon arrival. Pt is A&O X4. No acute distress noted

## 2016-02-01 NOTE — ED Notes (Signed)
Pt waiting on social worker to call her back about a ride.

## 2016-02-01 NOTE — ED Notes (Signed)
Called lab to make sure they could run the D-Dimer add-on. They stated they had the correct tube and they would run the test.

## 2016-02-01 NOTE — ED Notes (Addendum)
Pt ambulatory to restroom with steady gait noted  

## 2016-02-01 NOTE — ED Notes (Signed)
Called lab to see how much longer D-Dimer will take, they stated it is printing and will take 4-5 more minutes.

## 2016-02-01 NOTE — Discharge Instructions (Signed)
You have been seen in the emergency department today for chest pain. Your workup has shown normal results. As we discussed please follow-up with your primary care physician in the next 1-2 days for recheck. Return to the emergency department for any further chest pain, trouble breathing, or any other symptom personally concerning to yourself. ° °Nonspecific Chest Pain °It is often hard to find the cause of chest pain. There is always a chance that your pain could be related to something serious, such as a heart attack or a blood clot in your lungs. Chest pain can also be caused by conditions that are not life-threatening. If you have chest pain, it is very important to follow up with your doctor. ° °HOME CARE °· If you were prescribed an antibiotic medicine, finish it all even if you start to feel better. °· Avoid any activities that cause chest pain. °· Do not use any tobacco products, including cigarettes, chewing tobacco, or electronic cigarettes. If you need help quitting, ask your doctor. °· Do not drink alcohol. °· Take medicines only as told by your doctor. °· Keep all follow-up visits as told by your doctor. This is important. This includes any further testing if your chest pain does not go away. °· Your doctor may tell you to keep your head raised (elevated) while you sleep. °· Make lifestyle changes as told by your doctor. These may include: °¨ Getting regular exercise. Ask your doctor to suggest some activities that are safe for you. °¨ Eating a heart-healthy diet. Your doctor or a diet specialist (dietitian) can help you to learn healthy eating options. °¨ Maintaining a healthy weight. °¨ Managing diabetes, if necessary. °¨ Reducing stress. °GET HELP IF: °· Your chest pain does not go away, even after treatment. °· You have a rash with blisters on your chest. °· You have a fever. °GET HELP RIGHT AWAY IF: °· Your chest pain is worse. °· You have an increasing cough, or you cough up blood. °· You have  severe belly (abdominal) pain. °· You feel extremely weak. °· You pass out (faint). °· You have chills. °· You have sudden, unexplained chest discomfort. °· You have sudden, unexplained discomfort in your arms, back, neck, or jaw. °· You have shortness of breath at any time. °· You suddenly start to sweat, or your skin gets clammy. °· You feel nauseous. °· You vomit. °· You suddenly feel light-headed or dizzy. °· Your heart begins to beat quickly, or it feels like it is skipping beats. °These symptoms may be an emergency. Do not wait to see if the symptoms will go away. Get medical help right away. Call your local emergency services (911 in the [place] Do not drive yourself to the hospital. °  °This information is not intended to replace advice given to you by your health care provider. Make sure you discuss any questions you have with your health care provider. °  °Document Released: 01/25/2008 Document Revised: 08/29/2014 Document Reviewed: 03/14/2014 °Elsevier Interactive Patient Education ©2016 [employer] ° °

## 2016-02-11 ENCOUNTER — Telehealth: Payer: Self-pay | Admitting: Family Medicine

## 2016-02-11 NOTE — Telephone Encounter (Signed)
Pt wants to know if you will send a rx for Sudafed to the pharmacy.  She uses [facility]  Her callback is [phone]  Thanks, Barth Kirksteri

## 2016-02-16 ENCOUNTER — Other Ambulatory Visit: Payer: Self-pay | Admitting: Family Medicine

## 2016-04-10 ENCOUNTER — Emergency Department: Payer: Medicaid Other

## 2016-04-10 ENCOUNTER — Emergency Department
Admission: EM | Admit: 2016-04-10 | Discharge: 2016-04-10 | Disposition: A | Discharge status: Home / Self Care | Payer: Medicaid Other | Attending: Emergency Medicine | Admitting: Emergency Medicine

## 2016-04-10 ENCOUNTER — Encounter: Payer: Self-pay | Admitting: Emergency Medicine

## 2016-04-10 DIAGNOSIS — Z79899 Other long term (current) drug therapy: Secondary | ICD-10-CM | POA: Insufficient documentation

## 2016-04-10 DIAGNOSIS — S99921A Unspecified injury of right foot, initial encounter: Secondary | ICD-10-CM | POA: Diagnosis present

## 2016-04-10 DIAGNOSIS — Z7982 Long term (current) use of aspirin: Secondary | ICD-10-CM | POA: Diagnosis not present

## 2016-04-10 DIAGNOSIS — X501XXA Overexertion from prolonged static or awkward postures, initial encounter: Secondary | ICD-10-CM | POA: Insufficient documentation

## 2016-04-10 DIAGNOSIS — W1840XA Slipping, tripping and stumbling without falling, unspecified, initial encounter: Secondary | ICD-10-CM | POA: Insufficient documentation

## 2016-04-10 DIAGNOSIS — Z87891 Personal history of nicotine dependence: Secondary | ICD-10-CM | POA: Diagnosis not present

## 2016-04-10 DIAGNOSIS — Y939 Activity, unspecified: Secondary | ICD-10-CM | POA: Diagnosis not present

## 2016-04-10 DIAGNOSIS — Y929 Unspecified place or not applicable: Secondary | ICD-10-CM | POA: Insufficient documentation

## 2016-04-10 DIAGNOSIS — Y999 Unspecified external cause status: Secondary | ICD-10-CM | POA: Diagnosis not present

## 2016-04-10 DIAGNOSIS — S93401A Sprain of unspecified ligament of right ankle, initial encounter: Secondary | ICD-10-CM | POA: Diagnosis not present

## 2016-04-10 MED ORDER — NAPROXEN 500 MG PO TABS: 500.0000 mg | ORAL_TABLET | Freq: Two times a day (BID) | ORAL | 0 refills | Status: DC

## 2016-04-10 MED ORDER — TRAMADOL HCL 50 MG PO TABS: 50.0000 mg | ORAL_TABLET | Freq: Four times a day (QID) | ORAL | 0 refills | Status: AC | PRN

## 2016-04-10 MED ORDER — TRAMADOL HCL 50 MG PO TABS
100.0000 mg | ORAL_TABLET | Freq: Once | ORAL | Status: AC
  Administered 2016-04-10: 100 mg via ORAL
  Filled 2016-04-10: qty 2

## 2016-04-10 NOTE — ED Triage Notes (Signed)
Pt states tripped over her dog last night and injured her L foot. Pt presents with swelling and ace wrap to her foot.

## 2016-04-10 NOTE — ED Provider Notes (Signed)
[facility] Emergency Department Provider Note  ____________________________________________  Time seen: Approximately 9:19 PM  I have reviewed the triage vital signs and the nursing notes.   HISTORY  Chief Complaint Foot Pain    HPI Wendy Ferguson is a 35 y.o. female presents for evaluation of left foot and ankle pain. Patient states that she tripped over her dog last night twisting her ankle and foot. Patient presents with increased pain and swelling to her left foot. She describes pain as 8 out of 10 nonradiating.   Past Medical History:  Diagnosis Date  . Complication of anesthesia 09/2012   pt had a seizure during partial hysterectomy surgery   . Helicobacter pylori gastritis   . Ovarian cyst   . Seizures [facility](HCC)     Patient Active Problem List   Diagnosis Date Noted  . Excessive sleepiness 07/29/2015  . Fatigue 07/29/2015  . Frequent headaches 07/29/2015  . Hand numbness 07/29/2015  . Left ovarian cyst 07/29/2015  . Vitamin D deficiency 07/29/2015  . Headache disorder 04/21/2015  . Knee pain, right 03/20/2015  . Abnormal involuntary movement 01/07/2010  . Acne 01/07/2010  . Arthralgia of lower leg 01/07/2010  . Seizure disorder (HCC) 08/20/1999  . 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 ABDOMINAL HYSTERECTOMY    . Abscess of ovary  2009   removed scar tissue  . BRAIN SURGERY     to determine the cause of seizures.  plate put in head right forehead.  Marland Kitchen. CESAREAN SECTION    . KNEE ARTHROSCOPY Right 08/19/2015   Procedure: ARTHROSCOPY KNEE, LYSIS OF ADHESIONS AND PARTIAL SYNOVECTOMY;  Surgeon: Juanell FairlyKevin Krasinski, MD;  Location: [facility] ORS;  Service: Orthopedics;  Laterality: Right;  . KNEE SURGERY Right 08/22/17  . KNEE SURGERY Right 1989   Fell while having seizer.     Prior to Admission medications   Medication Sig Start Date End Date Taking? Authorizing Provider  aspirin EC  325 MG tablet Take 1 tablet (325 mg total) by mouth daily. 08/19/15   Juanell FairlyKevin Krasinski, MD  clotrimazole-betamethasone (LOTRISONE) cream Apply 1 application topically 2 (two) times daily. 09/08/15   Malva Limesonald E Fisher, MD  divalproex (DEPAKOTE) 500 MG DR tablet Take 1 tablet (500 mg total) by mouth 3 (three) times daily. 09/08/15   Malva Limesonald E Fisher, MD  folic acid (FOLVITE) 1 MG tablet Take 1 mg by mouth daily.    Historical Provider, MD  naproxen (NAPROSYN) 500 MG tablet Take 1 tablet (500 mg total) by mouth 2 (two) times daily with a meal. 04/10/16   Charmayne Sheerharles M Beers, PA-C  phenytoin (DILANTIN) 100 MG ER capsule Take 2 capsules by mouth. every morning , then take 2 capsules at night 12/25/14   Historical Provider, MD  traMADol (ULTRAM) 50 MG tablet Take 1 tablet (50 mg total) by mouth every 6 (six) hours as needed. 04/10/16 04/10/17  Charmayne Sheerharles M Beers, PA-C  Vitamin D, Ergocalciferol, (DRISDOL) 50000 units CAPS capsule TAKE ONE CAPSULE EVERY WEEK 02/16/16   Malva Limesonald E Fisher, MD    Allergies Erythromycin; Tylenol  [acetaminophen]; and Adhesive [tape]  History reviewed. No pertinent family history.  Social History Social History  Substance Use Topics  . Smoking status: Former Smoker    Packs/day: 1.00    Years: 13.00    Types: Cigarettes    Quit date: 09/02/2007  . Smokeless tobacco: Never Used  . Alcohol use No    Review of  Systems Constitutional: No fever/chills Cardiovascular: Denies chest pain. Respiratory: Denies shortness of breath. Musculoskeletal: Positive for right ankle pain. Skin: Negative for rash. Neurological: Negative for headaches, focal weakness or numbness.  10-point ROS otherwise negative.  ____________________________________________   PHYSICAL EXAM:  VITAL SIGNS: ED Triage Vitals  Enc Vitals Group     BP 04/10/16 2110 117/77     Pulse Rate 04/10/16 2110 86     Resp 04/10/16 2110 18     Temp 04/10/16 2110 98 F (36.7 C)     Temp Source 04/10/16 2110 Oral     SpO2  04/10/16 2110 98 %     Weight 04/10/16 2111 176 lb (79.8 kg)     Height 04/10/16 2111 5\' 6"  (1.676 m)     Head Circumference --      Peak Flow --      Pain Score 04/10/16 2111 8     Pain Loc --      Pain Edu? --      Excl. in GC? --     Constitutional: Alert and oriented. Well appearing and in no acute distress. Musculoskeletal: Right foot and ankle positive edema no notable swelling. Positive for bruising and ecchymosis as well. Distally neurovascularly intact good capillary refill. Neurologic:  Normal speech and language. No gross focal neurologic deficits are appreciated. No gait instability. Skin:  Skin is warm, dry and intact. No rash noted. Psychiatric: Mood and affect are normal. Speech and behavior are normal.  ____________________________________________   LABS (all labs ordered are listed, but only abnormal results are displayed)  Labs Reviewed - No data to display ____________________________________________  EKG   ____________________________________________  RADIOLOGY   ____________________________________________   PROCEDURES  Procedure(s) performed: None  Critical Care performed: No  ____________________________________________   INITIAL IMPRESSION / ASSESSMENT AND PLAN / ED COURSE  Pertinent labs & imaging results that were available during my care of the patient were reviewed by me and considered in my medical decision making (see chart for details). Review of the [place] CSRS was performed in accordance of the [facility] prior to dispensing any controlled drugs.  Acute right ankle and foot sprain. Previous fractures noted on the radiology films Rx given for tramadol and Naprosyn. Patient follow-up with orthopedics if continued pain. Ankle was placed in a Velcro stirrup and crutches provided for comfort.  Clinical Course    ____________________________________________   FINAL CLINICAL IMPRESSION(S) / ED DIAGNOSES  Final diagnoses:  Ankle sprain,  right, initial encounter     This chart was dictated using voice recognition software/Dragon. Despite best efforts to proofread, errors can occur which can change the meaning. Any change was purely unintentional.    Evangeline Dakinharles M Beers, PA-C 04/10/16 2218    Charmayne Sheerharles M Beers, PA-C 04/10/16 [id number]    Jene Everyobert Kinner, MD 04/10/16 2245

## 2016-04-10 NOTE — Discharge Instructions (Signed)
Wear the ankle splint as needed for comfort. Use crutches as needed for comfort and ease of walking. Up with orthopedics if continued pain.

## 2016-04-27 DIAGNOSIS — M25579 Pain in unspecified ankle and joints of unspecified foot: Secondary | ICD-10-CM | POA: Insufficient documentation

## 2016-06-06 ENCOUNTER — Other Ambulatory Visit: Payer: Self-pay | Admitting: Family Medicine

## 2016-10-16 IMAGING — DX DG FOOT COMPLETE 3+V*L*
3 series · 3 of 3 positions shown · non-contrast
Comparison: None.

CLINICAL DATA: Pain after running after dog 1 day ago. Unable to
bear weight today.

EXAM:
LEFT FOOT - COMPLETE 3+ VIEW

[foot ap]
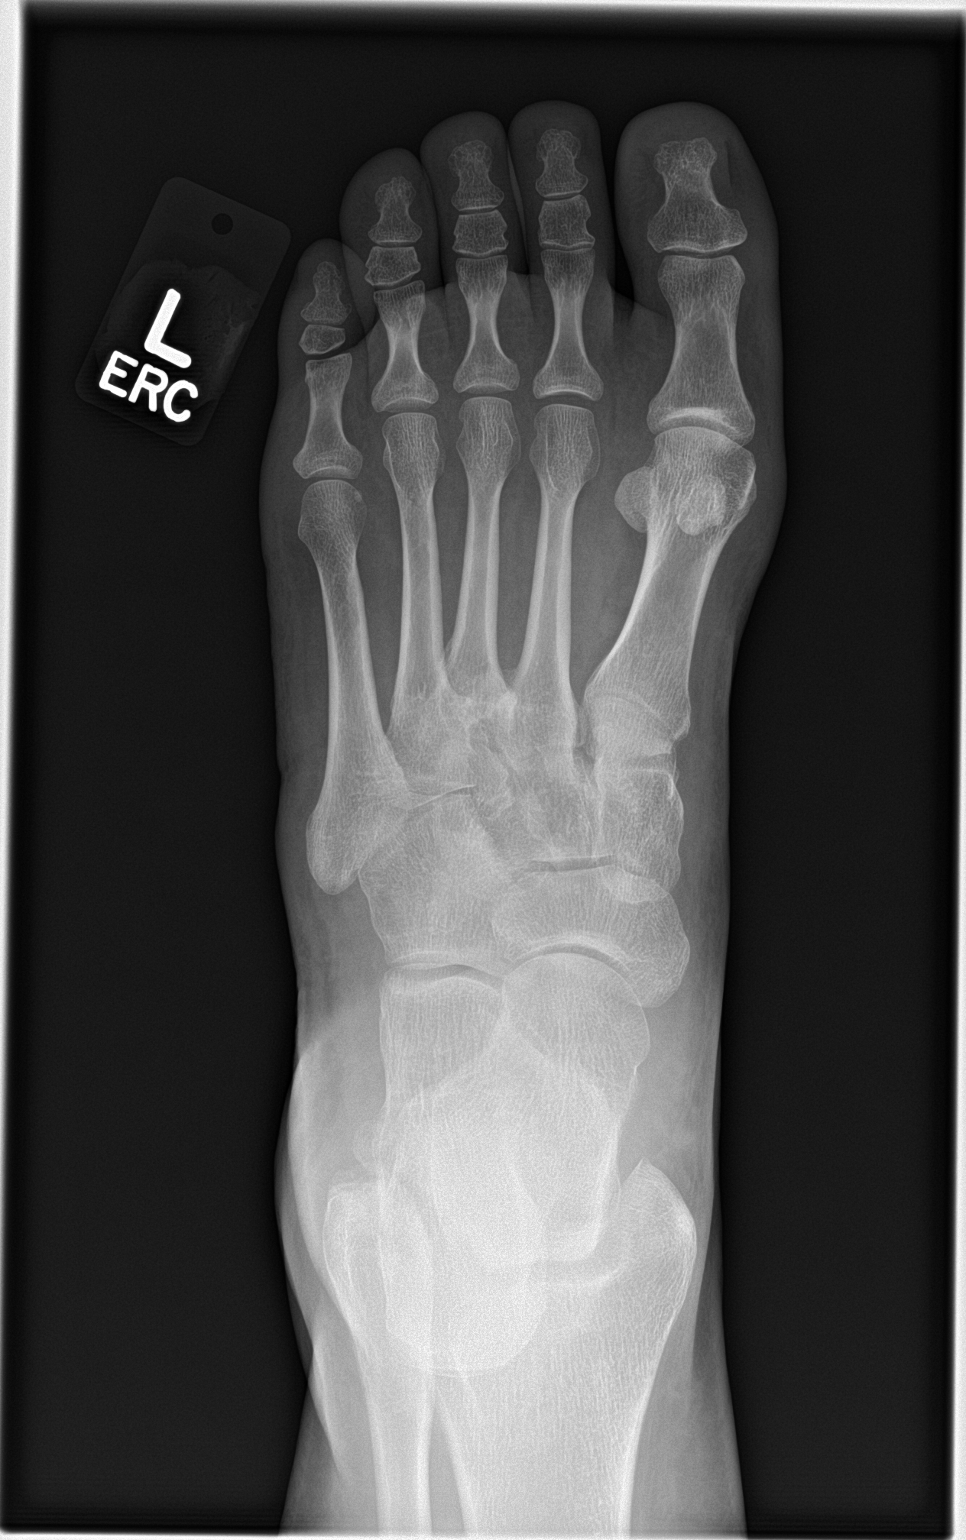

[foot obl]
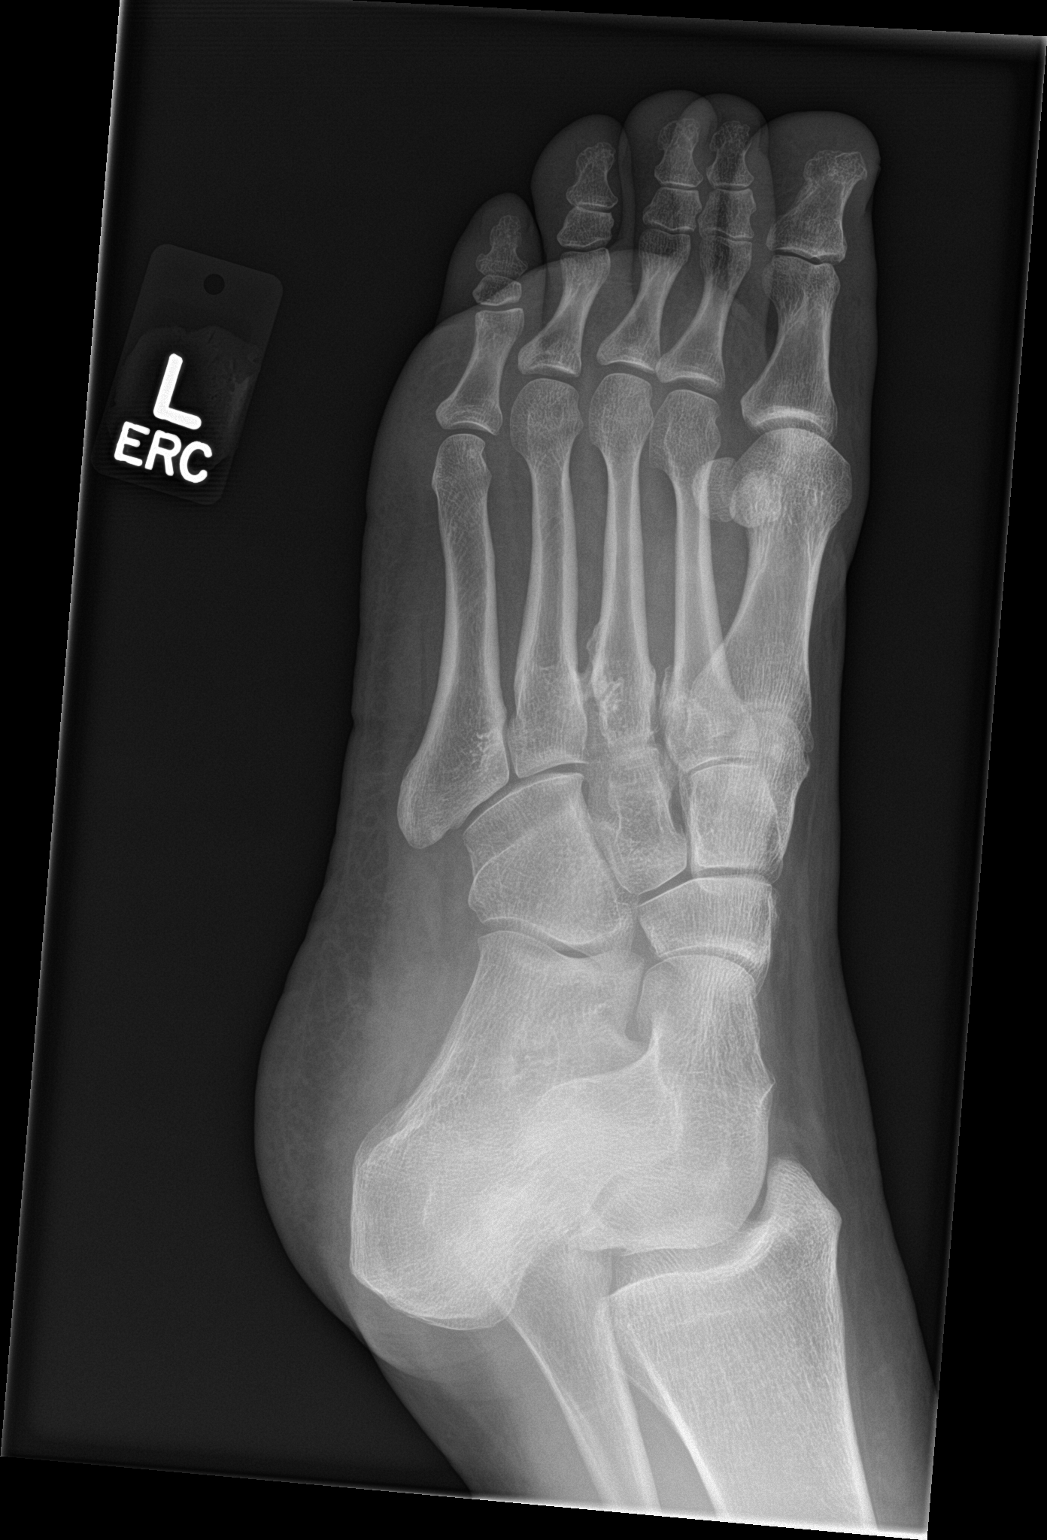

[foot lat]
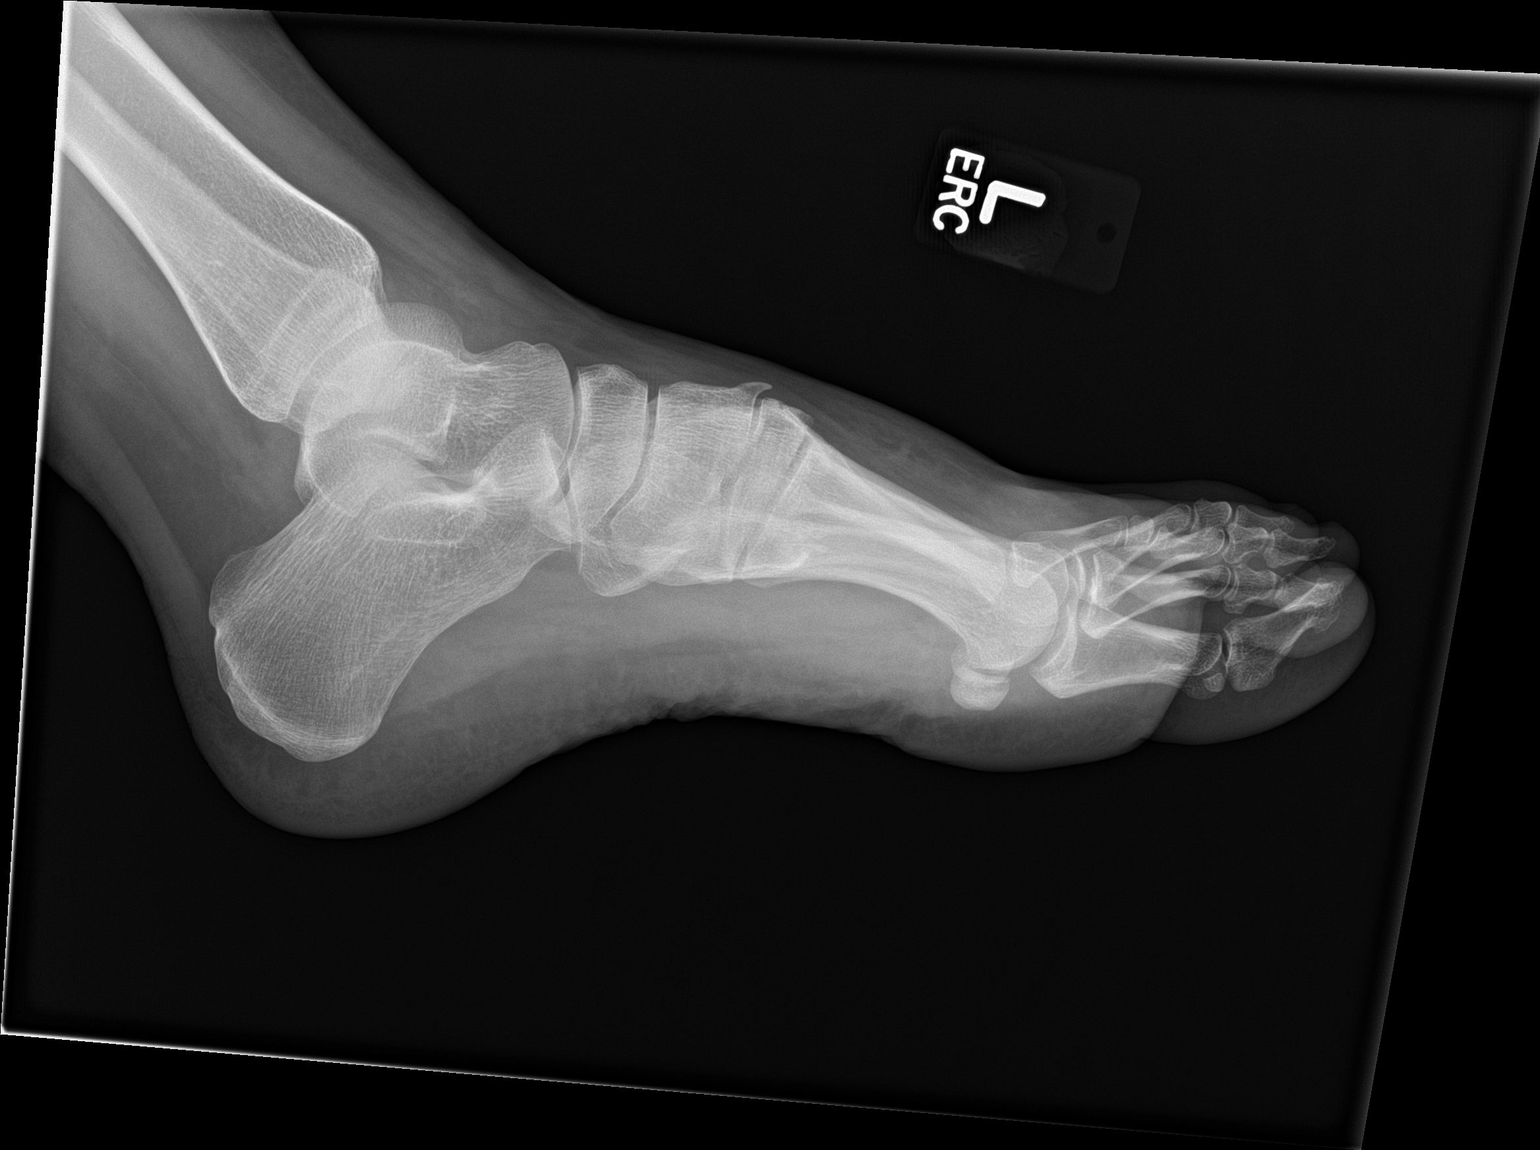

[3 of 3 positions shown; findings below may reference images not displayed]

FINDINGS: There is soft tissue swelling along the dorsum of the foot.
Degenerative changes are seen the midfoot on the lateral view. There
is bony irregularity at the base of the second and third
metatarsals. The finding at the base of the third metatarsal appears
to be chronic with bony formation and possible fusion with the
adjacent cuneiform. The finding at the base of the second metatarsal
is age indeterminate but given the findings of the base of the 3rd
could also be chronic. There appears to be appropriate alignment in
these regions with no evidence of Lisfranc injury. No other bony
abnormalities are seen.
IMPRESSION: 1. Soft tissue swelling along the dorsum of the foot.
2. Bony irregularity at the base of the second and third
metatarsals. The findings at the base of the third metatarsal appear
to be chronic. The findings at the base of the second metatarsal are
age-indeterminate based on imaging. Recommend clinical correlation
in this region.

## 2017-01-20 ENCOUNTER — Telehealth: Payer: Self-pay

## 2017-01-20 NOTE — Telephone Encounter (Signed)
Patient is requesting a RX for lice be sent to [facility]. She reports being around some children that has been diagnosed with lice and now her head is itching.  CB#[phone]

## 2017-01-20 NOTE — Telephone Encounter (Signed)
lmtcb-aa 

## 2017-01-20 NOTE — Telephone Encounter (Signed)
Need to use OTC Rid

## 2017-01-20 NOTE — Telephone Encounter (Signed)
Pt advised-aa 

## 2017-01-20 NOTE — Telephone Encounter (Signed)
Please advise 

## 2017-04-25 ENCOUNTER — Ambulatory Visit (INDEPENDENT_AMBULATORY_CARE_PROVIDER_SITE_OTHER): Payer: Medicaid Other | Admitting: Family Medicine

## 2017-04-25 ENCOUNTER — Encounter: Payer: Self-pay | Admitting: Family Medicine

## 2017-04-25 VITALS — BP 128/82 | HR 82 | Temp 97.5°F | Ht 65.0 in | Wt 196.2 lb

## 2017-04-25 DIAGNOSIS — G40909 Epilepsy, unspecified, not intractable, without status epilepticus: Secondary | ICD-10-CM

## 2017-04-25 DIAGNOSIS — Z0000 Encounter for general adult medical examination without abnormal findings: Secondary | ICD-10-CM | POA: Diagnosis not present

## 2017-04-25 DIAGNOSIS — Z114 Encounter for screening for human immunodeficiency virus [HIV]: Secondary | ICD-10-CM | POA: Diagnosis not present

## 2017-04-25 DIAGNOSIS — M25561 Pain in right knee: Secondary | ICD-10-CM | POA: Diagnosis not present

## 2017-04-25 DIAGNOSIS — Z23 Encounter for immunization: Secondary | ICD-10-CM | POA: Diagnosis not present

## 2017-04-25 LAB — POCT URINALYSIS DIPSTICK
BILIRUBIN UA: NEGATIVE
Glucose, UA: NEGATIVE
KETONES UA: 15
Leukocytes, UA: NEGATIVE
NITRITE UA: NEGATIVE
PH UA: 5 (ref 5.0–8.0)
RBC UA: NEGATIVE
Urobilinogen, UA: 0.2 E.U./dL

## 2017-04-25 NOTE — Progress Notes (Signed)
Patient: Wendy Ferguson, Female    DOB: 1980-12-02, 36 y.o.   MRN: [id number] Visit Date: 04/25/2017  Today's Provider: Dortha Kern, PA   Chief Complaint  Patient presents with  . Annual Exam   Subjective:    Annual physical exam Wendy Ferguson is a 36 y.o. female who presents today for health maintenance and complete physical. She feels fairly well. She reports exercising daily walking her dogs. She reports she is sleeping poorly.  -----------------------------------------------------------------   Review of Systems  Constitutional: Negative.   HENT: Negative.   Eyes: Positive for photophobia.  Respiratory: Negative.   Cardiovascular: Negative.   Gastrointestinal: Positive for abdominal distention and abdominal pain.  Endocrine: Positive for polydipsia and polyuria.  Genitourinary: Positive for vaginal discharge.  Musculoskeletal: Positive for joint swelling and myalgias.  Skin: Negative.   Allergic/Immunologic: Negative.   Neurological: Positive for seizures.  Hematological: Bruises/bleeds easily.  Psychiatric/Behavioral: Positive for sleep disturbance.    Social History      She  reports that she quit smoking about 9 years ago. Her smoking use included Cigarettes. She has a 13.00 pack-year smoking history. She has never used smokeless tobacco. She reports that she does not drink alcohol or use drugs.       Social History   Social History  . Marital status: Divorced    Spouse name: N/A  . Number of children: 1  . Years of education: N/A   Occupational History  . Unemployed    Social History Main Topics  . Smoking status: Former Smoker    Packs/day: 1.00    Years: 13.00    Types: Cigarettes    Quit date: 09/02/2007  . Smokeless tobacco: Never Used  . Alcohol use No  . Drug use: No  . Sexual activity: Not Asked   Other Topics Concern  . None   Social History Narrative  . None    Past Medical History:  Diagnosis Date  . Complication  of anesthesia 09/2012   pt had a seizure during partial hysterectomy surgery   . Helicobacter pylori gastritis   . Ovarian cyst   . Seizures [facility])      Patient Active Problem List   Diagnosis Date Noted  . Ankle pain 04/27/2016  . Abnormal brain MRI 09/16/2015  . Excessive sleepiness 07/29/2015  . Fatigue 07/29/2015  . Frequent headaches 07/29/2015  . Hand numbness 07/29/2015  . Left ovarian cyst 07/29/2015  . Vitamin D deficiency 07/29/2015  . Headache disorder 04/21/2015  . Dispenza 04/21/2015  . Knee pain, right 03/20/2015  . Seizure (HCC) 03/16/2015  . Abnormal involuntary movement 01/07/2010  . Acne 01/07/2010  . Arthralgia of lower leg 01/07/2010  . Seizure disorder (HCC) 08/20/1999  . 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 ABDOMINAL HYSTERECTOMY    . Abscess of ovary  2009   removed scar tissue  . BRAIN SURGERY     to determine the cause of seizures.  plate put in head right forehead.  Marland Kitchen CESAREAN SECTION    . KNEE ARTHROSCOPY Right 08/19/2015   Procedure: ARTHROSCOPY KNEE, LYSIS OF ADHESIONS AND PARTIAL SYNOVECTOMY;  Surgeon: Juanell Fairly, MD;  Location: [facility] ORS;  Service: Orthopedics;  Laterality: Right;  . KNEE SURGERY Right 08/22/17  . KNEE SURGERY Right 1989   Fell while having seizer.     Family History        Family Status  Relation  Status  . Mother Alive  . Father Alive       disabled in one arm  . Sister Alive  . Brother Alive  . Sister Alive  . MGM Deceased  . MGF Alive  . PGM Alive  . PGF Alive        Her family history includes Breast cancer in her mother; Skin cancer in her mother.     Allergies  Allergen Reactions  . Bee Venom     Other reaction(s): Other (See Comments) Chest pain, sweating, fever  . Tylenol  [Acetaminophen]   . Adhesive [Tape] Rash    With certain bandaids cause a rash  . Erythromycin Rash     Current Outpatient Prescriptions:  .  aspirin EC 325  MG tablet, Take 1 tablet (325 mg total) by mouth daily., Disp: 45 tablet, Rfl: 0 .  divalproex (DEPAKOTE) 500 MG DR tablet, Take 1 tablet (500 mg total) by mouth 3 (three) times daily., Disp: 90 tablet, Rfl: 12 .  gabapentin (NEURONTIN) 100 MG capsule, Take 1-2 tabs nightly, Disp: , Rfl:  .  phenytoin (DILANTIN) 100 MG ER capsule, Take 2 capsules by mouth. every morning , then take 2 capsules at night, Disp: , Rfl:  .  topiramate (TOPAMAX) 25 MG tablet, Take by mouth., Disp: , Rfl:  .  folic acid (FOLVITE) 1 MG tablet, Take 1 mg by mouth daily., Disp: , Rfl:  .  naproxen (NAPROSYN) 500 MG tablet, Take 1 tablet (500 mg total) by mouth 2 (two) times daily with a meal. (Patient not taking: Reported on 04/25/2017), Disp: 60 tablet, Rfl: 0 .  Vitamin D, Ergocalciferol, (DRISDOL) 50000 units CAPS capsule, TAKE ONE CAPSULE EVERY WEEK (Patient not taking: Reported on 04/25/2017), Disp: 12 capsule, Rfl: 4   Patient Care Team: Malva Limes, MD as PCP - General (Family Medicine) Morene Crocker, MD as Referring Physician (Neurology)      Objective:   Vitals: BP 128/82 (BP Location: Right Arm, Patient Position: Sitting, Cuff Size: Normal)   Pulse 82   Temp (!) 97.5 F (36.4 C) (Oral)   Ht 5\' 5"  (1.651 m)   Wt 196 lb 3.2 oz (89 kg)   SpO2 98%   BMI 32.65 kg/m    Wt Readings from Last 3 Encounters:  04/25/17 196 lb 3.2 oz (89 kg)  04/10/16 176 lb (79.8 kg)  02/01/16 174 lb (78.9 kg)    Vitals:   04/25/17 1407  BP: 128/82  Pulse: 82  Temp: (!) 97.5 F (36.4 C)  TempSrc: Oral  SpO2: 98%  Weight: 196 lb 3.2 oz (89 kg)  Height: 5\' 5"  (1.651 m)     Physical Exam  Constitutional: She is oriented to person, place, and time. She appears well-developed and well-nourished.  HENT:  Head: Normocephalic and atraumatic.  Right Ear: External ear normal.  Left Ear: External ear normal.  Nose: Nose normal.  Mouth/Throat: Oropharynx is clear and moist.  Eyes: Pupils are equal, round, and  reactive to light. Conjunctivae and EOM are normal. Right eye exhibits no discharge.  Neck: Normal range of motion. Neck supple. No tracheal deviation present. No thyromegaly present.  Cardiovascular: Normal rate, regular rhythm, normal heart sounds and intact distal pulses.   No murmur heard. Pulmonary/Chest: Effort normal and breath sounds normal. No respiratory distress. She has no wheezes. She has no rales. She exhibits no tenderness.  Abdominal: Soft. She exhibits no distension and no mass. There is no tenderness. There is no  rebound and no guarding.  Genitourinary: No vaginal discharge found.  Genitourinary Comments: Cervix smooth with open os. No adnexal masses. History of supracervical hysterectomy with right salpingo-oophorectomy and left salpingectomy for chronic pelvic pain by Dr. Feliberto GottronSchermerhorn on 08-29-12. Normal breast exam without masses, dimpling, nipple discharge or local lymphadenopathy.  Musculoskeletal: Normal range of motion. She exhibits no edema or tenderness.  Lymphadenopathy:    She has no cervical adenopathy.  Neurological: She is alert and oriented to person, place, and time. She has normal reflexes. No cranial nerve deficit. She exhibits normal muscle tone. Coordination normal.  Skin: Skin is warm and dry. No rash noted. No erythema.  Psychiatric: She has a normal mood and affect. Judgment and thought content normal. She is slowed.   Depression Screen PHQ 2/9 Scores 04/25/2017  PHQ - 2 Score 0   Assessment & Plan:     Routine Health Maintenance and Physical Exam  Exercise Activities and Dietary recommendations Goals    Walk dog to the mailbox and back each day (approximately one mile).      Immunization History  Administered Date(s) Administered  . Tdap 09/06/2006    Health Maintenance  Topic Date Due  . HIV Screening  04/15/1996  . PAP SMEAR  04/15/2002  . TETANUS/TDAP  09/06/2016  . INFLUENZA VACCINE  11/19/2017 (Originally 03/22/2017)   Discussed  health benefits of physical activity, and encouraged her to engage in regular exercise appropriate for her age and condition.    -------------------------------------------------------------------- 1. Annual physical exam General health stable with history of seizures and right knee arthritis. History of supracervical hysterectomy, right salpingo-oophorectomy and left salpingectomy by Dr. Feliberto GottronSchermerhorn (GYN) on 08-29-12 for chronic pelvic pain. Cervix still present and warrants PAP today. Continues to exercise by walking her dog. Will get routine labs and follow up prn. - Pap IG (Image Guided) - CBC With Differential - Lipid panel - Comprehensive metabolic panel - TSH - POCT Urinalysis Dipstick  2. Seizure disorder (HCC) Still on Depakote 500 mg TID, Topamax 25 mg 2 tablets at bedtime and Gabapentin 300 mg at bedtime. Followed by Dr. Malvin JohnsPotter (neurologist) regularly (last appointment was 03-14-17). No seizures in the past year.  - CBC With Differential - Comprehensive metabolic panel - TSH - POCT Urinalysis Dipstick  3. Arthralgia of right lower leg No new injury. History of arthroscopy and injections by Dr. Real ConsKrazinski (orthopedist). Follow up with him prn. - CBC With Differential  4. Need for tetanus booster - Td : Tetanus/diphtheria >7yo Preservative  free  5. Screening for HIV (human immunodeficiency virus) - HIV antibody    Dortha Kernennis Enyla Lisbon, PA  [facility]

## 2017-04-26 LAB — PAP IG (IMAGE GUIDED): PAP SMEAR COMMENT: 0

## 2017-04-27 ENCOUNTER — Telehealth: Payer: Self-pay

## 2017-04-27 NOTE — Telephone Encounter (Signed)
-----   Message from Tamsen Roersennis E Chrismon, [place]PA sent at 04/27/2017  8:20 AM EDT ----- All blood tests and PAP smear normal. Awaiting final report of HIV screening test.

## 2017-04-27 NOTE — Telephone Encounter (Signed)
Pt returned called   Thanks teri

## 2017-04-27 NOTE — Telephone Encounter (Signed)
Pt stated she was returning Laura's call. Thanks TNP °

## 2017-04-27 NOTE — Telephone Encounter (Signed)
LMTCB 04/27/2017  Thanks,   -Otie Headlee  

## 2017-04-27 NOTE — Telephone Encounter (Signed)
Pt advised.   Thanks,   -Laura  

## 2017-04-28 LAB — COMPREHENSIVE METABOLIC PANEL
A/G RATIO: 1.7 (ref 1.2–2.2)
ALT: 54 IU/L — AB (ref 0–32)
AST: 40 IU/L (ref 0–40)
Albumin: 4.3 g/dL (ref 3.5–5.5)
Alkaline Phosphatase: 30 IU/L — ABNORMAL LOW (ref 39–117)
BILIRUBIN TOTAL: 0.5 mg/dL (ref 0.0–1.2)
BUN/Creatinine Ratio: 16 (ref 9–23)
BUN: 16 mg/dL (ref 6–20)
CHLORIDE: 105 mmol/L (ref 96–106)
CO2: 21 mmol/L (ref 20–29)
Calcium: 10 mg/dL (ref 8.7–10.2)
Creatinine, Ser: 0.99 mg/dL (ref 0.57–1.00)
GFR calc Af Amer: 85 mL/min/{1.73_m2} (ref >59)
GFR calc non Af Amer: 74 mL/min/{1.73_m2} (ref >59)
GLUCOSE: 82 mg/dL (ref 65–99)
Globulin, Total: 2.6 g/dL (ref 1.5–4.5)
POTASSIUM: 4.3 mmol/L (ref 3.5–5.2)
Sodium: 143 mmol/L (ref 134–144)
Total Protein: 6.9 g/dL (ref 6.0–8.5)

## 2017-04-28 LAB — CBC WITH DIFFERENTIAL
BASOS ABS: 0 10*3/uL (ref 0.0–0.2)
Basos: 0 %
EOS (ABSOLUTE): 0.1 10*3/uL (ref 0.0–0.4)
Eos: 2 %
Hematocrit: 42 % (ref 34.0–46.6)
Hemoglobin: 14.3 g/dL (ref 11.1–15.9)
IMMATURE GRANS (ABS): 0 10*3/uL (ref 0.0–0.1)
IMMATURE GRANULOCYTES: 0 %
LYMPHS: 46 %
Lymphocytes Absolute: 3.3 10*3/uL — ABNORMAL HIGH (ref 0.7–3.1)
MCH: 31.3 pg (ref 26.6–33.0)
MCHC: 34 g/dL (ref 31.5–35.7)
MCV: 92 fL (ref 79–97)
Monocytes Absolute: 0.9 10*3/uL (ref 0.1–0.9)
Monocytes: 13 %
NEUTROS PCT: 39 %
Neutrophils Absolute: 2.8 10*3/uL (ref 1.4–7.0)
RBC: 4.57 x10E6/uL (ref 3.77–5.28)
RDW: 14.6 % (ref 12.3–15.4)
WBC: 7.2 10*3/uL (ref 3.4–10.8)

## 2017-04-28 LAB — HIV ANTIBODY (ROUTINE TESTING W REFLEX): HIV SCREEN 4TH GENERATION: REACTIVE — AB

## 2017-04-28 LAB — HIV 1/2 AB DIFFERENTIATION
HIV 1 AB: NEGATIVE
HIV 2 Ab: NEGATIVE
NOTE (HIV CONF MULTIP: NEGATIVE

## 2017-04-28 LAB — LIPID PANEL
Chol/HDL Ratio: 2.2 ratio (ref 0.0–4.4)
Cholesterol, Total: 152 mg/dL (ref 100–199)
HDL: 69 mg/dL (ref >39)
LDL CALC: 67 mg/dL (ref 0–99)
TRIGLYCERIDES: 82 mg/dL (ref 0–149)
VLDL Cholesterol Cal: 16 mg/dL (ref 5–40)

## 2017-04-28 LAB — TSH: TSH: 2.65 u[IU]/mL (ref 0.450–4.500)

## 2017-04-28 LAB — RNA QUALITATIVE: HIV 1 RNA QUALITATIVE: NEGATIVE

## 2017-05-03 DIAGNOSIS — G43101 Migraine with aura, not intractable, with status migrainosus: Secondary | ICD-10-CM | POA: Diagnosis not present

## 2017-06-14 ENCOUNTER — Ambulatory Visit: Payer: Medicaid Other | Admitting: Family Medicine

## 2017-06-15 ENCOUNTER — Encounter: Payer: Self-pay | Admitting: Family Medicine

## 2017-06-15 ENCOUNTER — Ambulatory Visit
Admission: RE | Admit: 2017-06-15 | Discharge: 2017-06-15 | Disposition: A | Discharge status: Home / Self Care | Payer: Medicaid Other | Source: Ambulatory Visit | Attending: Family Medicine | Admitting: Family Medicine

## 2017-06-15 ENCOUNTER — Ambulatory Visit (INDEPENDENT_AMBULATORY_CARE_PROVIDER_SITE_OTHER): Payer: Medicaid Other | Admitting: Family Medicine

## 2017-06-15 ENCOUNTER — Telehealth: Payer: Self-pay | Admitting: *Deleted

## 2017-06-15 VITALS — BP 104/62 | HR 74 | Temp 98.4°F | Resp 16 | Wt 196.0 lb

## 2017-06-15 DIAGNOSIS — L089 Local infection of the skin and subcutaneous tissue, unspecified: Secondary | ICD-10-CM

## 2017-06-15 DIAGNOSIS — M25541 Pain in joints of right hand: Secondary | ICD-10-CM

## 2017-06-15 DIAGNOSIS — L723 Sebaceous cyst: Secondary | ICD-10-CM

## 2017-06-15 MED ORDER — MELOXICAM 15 MG PO TABS: 15.0000 mg | ORAL_TABLET | Freq: Every day | ORAL | 0 refills | Status: DC

## 2017-06-15 MED ORDER — DOXYCYCLINE HYCLATE 100 MG PO TABS: 100.0000 mg | ORAL_TABLET | Freq: Two times a day (BID) | ORAL | 0 refills | Status: DC

## 2017-06-15 NOTE — Progress Notes (Signed)
Patient: Wendy KidaBridgett E Buttacavoli Female    DOB: October 07, 1980   36 y.o.   MRN: [id number] Visit Date: 06/15/2017  Today's Provider: Mila Merryonald Fisher, MD   Chief Complaint  Patient presents with  . Cyst   Subjective:    HPI Patient comes in today c/o of a cyst on the left side of her face. She reports that it has been there X 2 weeks. It has gotten bigger in size. She has not used anything OTC for symptoms. Denies fever. It is tender to touch and turning red.   Also complains of pains in fingers of right hand for a couple of week.s no known injury, is worse in 5th digit.     Allergies  Allergen Reactions  . Bee Venom     Other reaction(s): Other (See Comments) Chest pain, sweating, fever  . Tylenol  [Acetaminophen]   . Adhesive [Tape] Rash    With certain bandaids cause a rash  . Erythromycin Rash     Current Outpatient Prescriptions:  .  aspirin EC 325 MG tablet, Take 1 tablet (325 mg total) by mouth daily., Disp: 45 tablet, Rfl: 0 .  divalproex (DEPAKOTE) 500 MG DR tablet, Take 1 tablet (500 mg total) by mouth 3 (three) times daily., Disp: 90 tablet, Rfl: 12 .  folic acid (FOLVITE) 1 MG tablet, Take 1 mg by mouth daily., Disp: , Rfl:  .  gabapentin (NEURONTIN) 100 MG capsule, Take 1-2 tabs nightly, Disp: , Rfl:  .  phenytoin (DILANTIN) 100 MG ER capsule, Take 2 capsules by mouth. every morning , then take 2 capsules at night, Disp: , Rfl:  .  topiramate (TOPAMAX) 25 MG tablet, Take by mouth., Disp: , Rfl:  .  naproxen (NAPROSYN) 500 MG tablet, Take 1 tablet (500 mg total) by mouth 2 (two) times daily with a meal. (Patient not taking: Reported on 04/25/2017), Disp: 60 tablet, Rfl: 0 .  Vitamin D, Ergocalciferol, (DRISDOL) 50000 units CAPS capsule, TAKE ONE CAPSULE EVERY WEEK (Patient not taking: Reported on 04/25/2017), Disp: 12 capsule, Rfl: 4  Review of Systems  Constitutional: Negative for activity change, appetite change, chills, diaphoresis, fatigue and fever.  Skin: Positive  for color change and wound. Negative for pallor and rash.  Allergic/Immunologic: Negative.   Neurological: Negative for numbness and headaches.  Hematological: Negative for adenopathy. Does not bruise/bleed easily.    Social History  Substance Use Topics  . Smoking status: Former Smoker    Packs/day: 1.00    Years: 13.00    Types: Cigarettes    Quit date: 09/02/2007  . Smokeless tobacco: Never Used  . Alcohol use No   Objective:   BP 104/62 (BP Location: Left Arm, Patient Position: Sitting, Cuff Size: Normal)   Pulse 74   Temp 98.4 F (36.9 C)   Resp 16   Wt 196 lb (88.9 kg)   SpO2 98%   BMI 32.62 kg/m  Vitals:   06/15/17 1046  BP: 104/62  Pulse: 74  Resp: 16  Temp: 98.4 F (36.9 C)  SpO2: 98%  Weight: 196 lb (88.9 kg)     Physical Exam   General Appearance:    Alert, cooperative, no distress  Eyes:    PERRL, conjunctiva/corneas clear, EOM's intact       Lungs:     Clear to auscultation bilaterally, respirations unlabored  Heart:    Regular rate and rhythm  MS:   Diffuse tenderness of joints of digits of right fingers  with mild swelling, worse with 5th digit.    Derm:   Pea sized tender cystic mass on erythematous base       Assessment & Plan:      1. Infected sebaceous cyst of skin Recurrent problem occurring at site of mole of her left cheek. She would like to see dermatologist about possible excision of underlying mole.  - doxycycline (VIBRA-TABS) 100 MG tablet; Take 1 tablet (100 mg total) by mouth 2 (two) times daily.  Dispense: 20 tablet; Refill: 0 - Ambulatory referral to Dermatology  2. Joint pain in fingers of right hand  - DG Hand Complete Right; Future       [name], MD  [facility]

## 2017-06-15 NOTE — Telephone Encounter (Signed)
Patient was notified of results. Rx sent to pharmacy. 

## 2017-06-15 NOTE — Telephone Encounter (Signed)
-----   Message from Malva Limesonald E Fisher, MD sent at 06/15/2017  1:04 PM EDT ----- Jillyn Hiddenxrays are normal. Please start meloxicam 15mg  once a day, #30, rf x0 for pain in fingers. Call for rheumatology referral if not much better in 2-3 weeks.

## 2017-06-15 NOTE — Patient Instructions (Signed)
Go to the [facility] on [address] for right hand Xray  

## 2017-06-17 ENCOUNTER — Encounter: Payer: Self-pay | Admitting: Family Medicine

## 2017-06-20 ENCOUNTER — Encounter: Payer: Self-pay | Admitting: Family Medicine

## 2017-06-21 ENCOUNTER — Encounter: Payer: Self-pay | Admitting: Family Medicine

## 2017-06-21 ENCOUNTER — Telehealth: Payer: Self-pay | Admitting: Family Medicine

## 2017-06-21 NOTE — Telephone Encounter (Signed)
Please advise 

## 2017-06-21 NOTE — Telephone Encounter (Signed)
She needs to keep any open lesions covered with a band-aid

## 2017-06-21 NOTE — Telephone Encounter (Signed)
Pt states she came in last week with a knot on her face.  Pt states the place on her face is open some.  Pt is asking id it is ok to go out in the night air with it open.  CB#[phone]

## 2017-06-21 NOTE — Telephone Encounter (Signed)
Advised patient as below.  

## 2017-06-23 ENCOUNTER — Encounter: Payer: Self-pay | Admitting: Family Medicine

## 2017-07-05 DIAGNOSIS — D225 Melanocytic nevi of trunk: Secondary | ICD-10-CM | POA: Diagnosis not present

## 2017-07-05 DIAGNOSIS — L720 Epidermal cyst: Secondary | ICD-10-CM | POA: Diagnosis not present

## 2017-07-05 DIAGNOSIS — D2239 Melanocytic nevi of other parts of face: Secondary | ICD-10-CM | POA: Diagnosis not present

## 2017-07-05 DIAGNOSIS — D2262 Melanocytic nevi of left upper limb, including shoulder: Secondary | ICD-10-CM | POA: Diagnosis not present

## 2017-12-21 IMAGING — CR DG HAND COMPLETE 3+V*R*
1 series · 3 of 3 positions shown · non-contrast
Comparison: None.

CLINICAL DATA: Pain and stiffness in the fingers for several weeks,
no injury

EXAM:
RIGHT HAND - COMPLETE 3+ VIEW

[Series 1: dg hand complete right · 0.14mm/px · 3 of 3 slices shown]
[im 1/3]
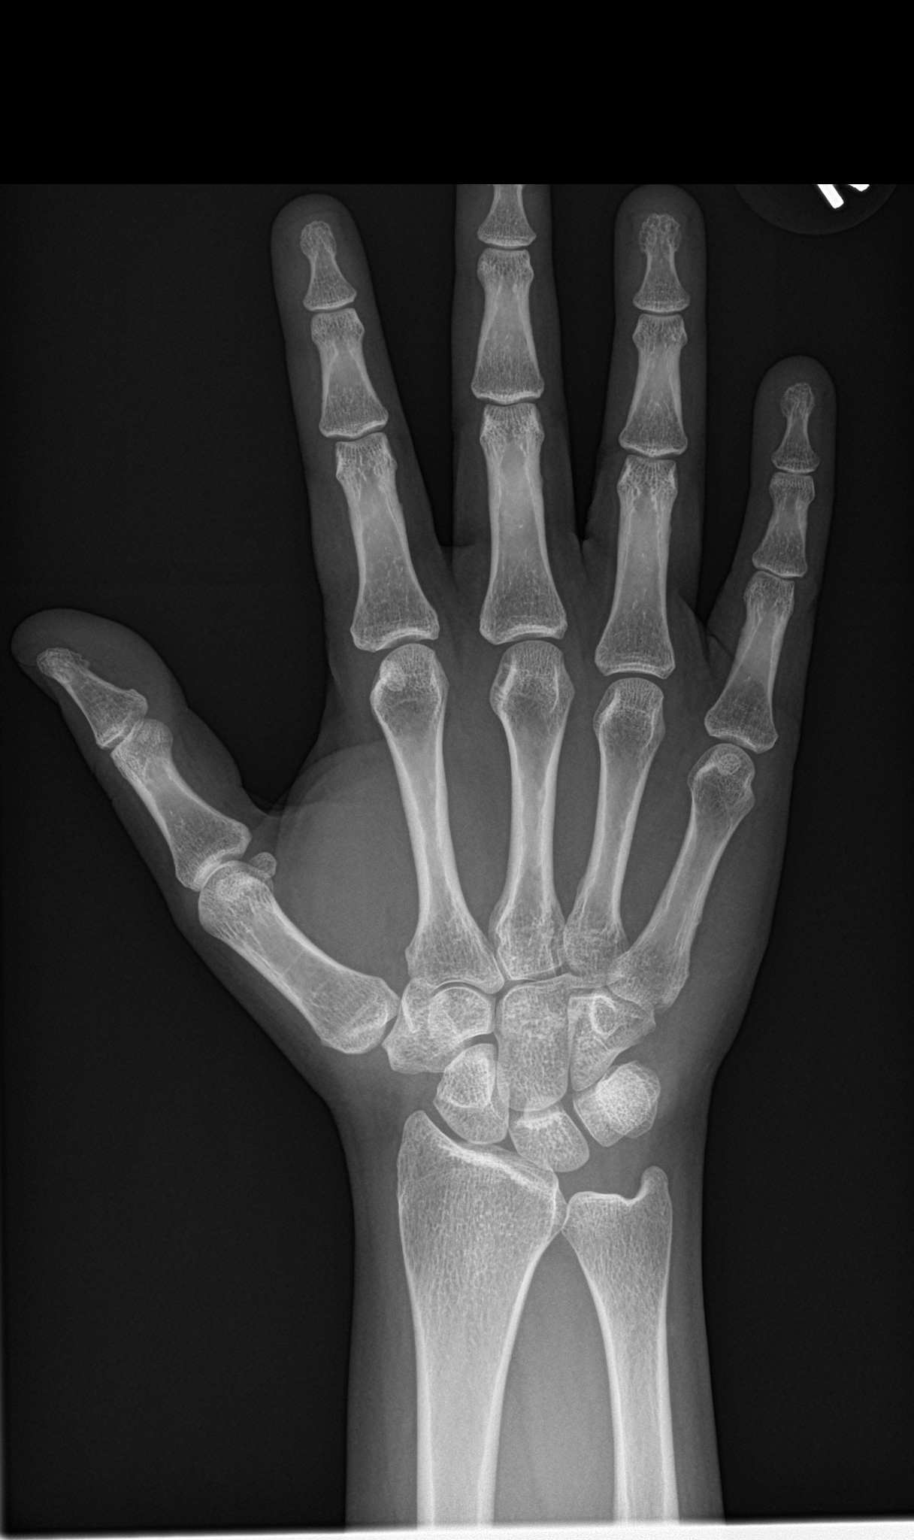
[im 2/3]
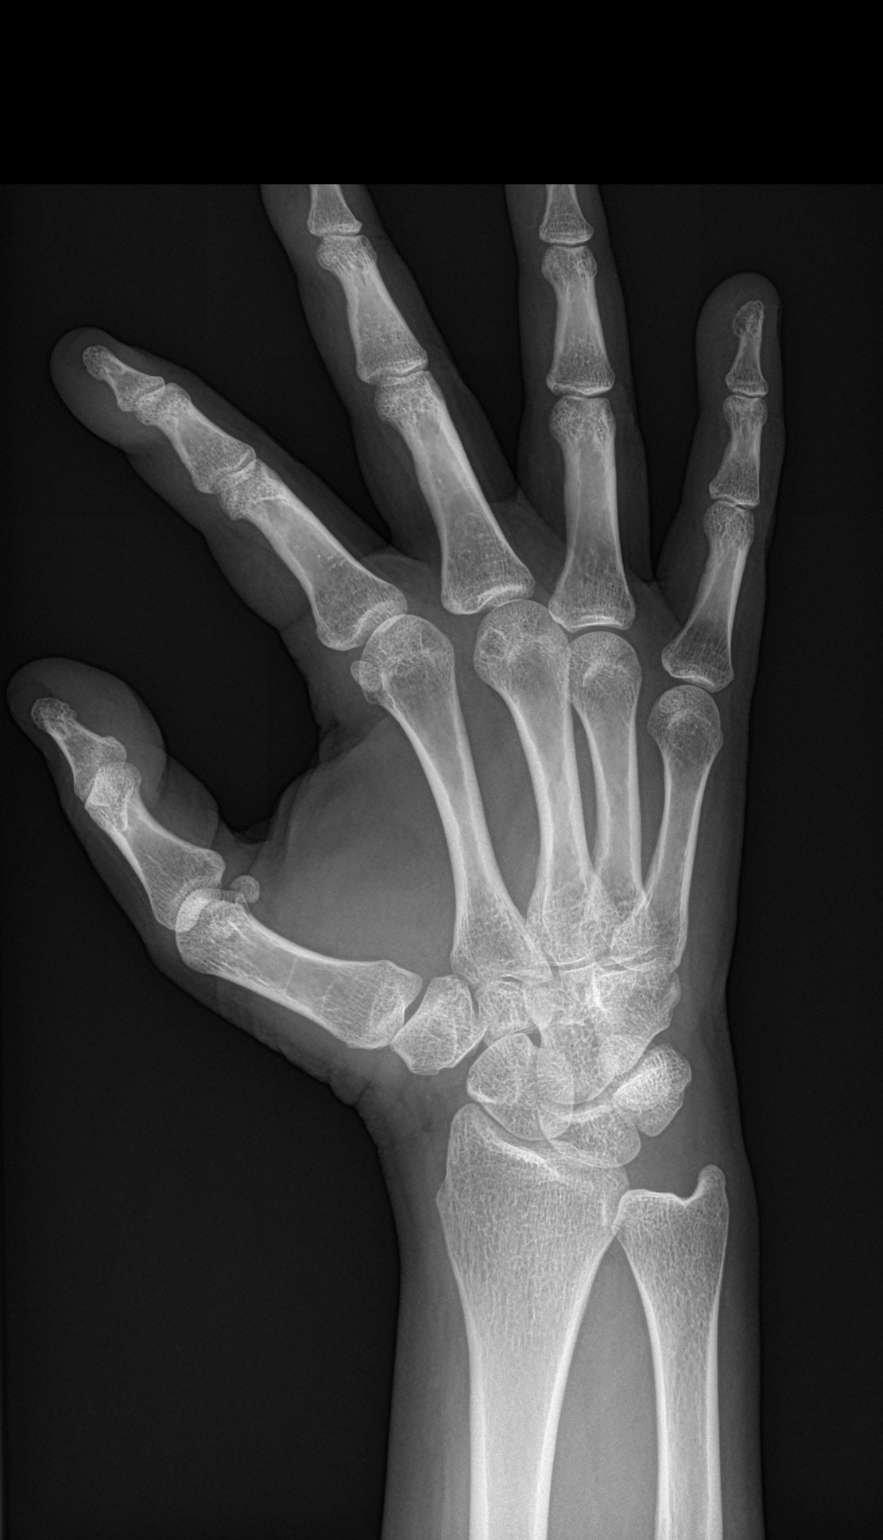
[im 3/3]
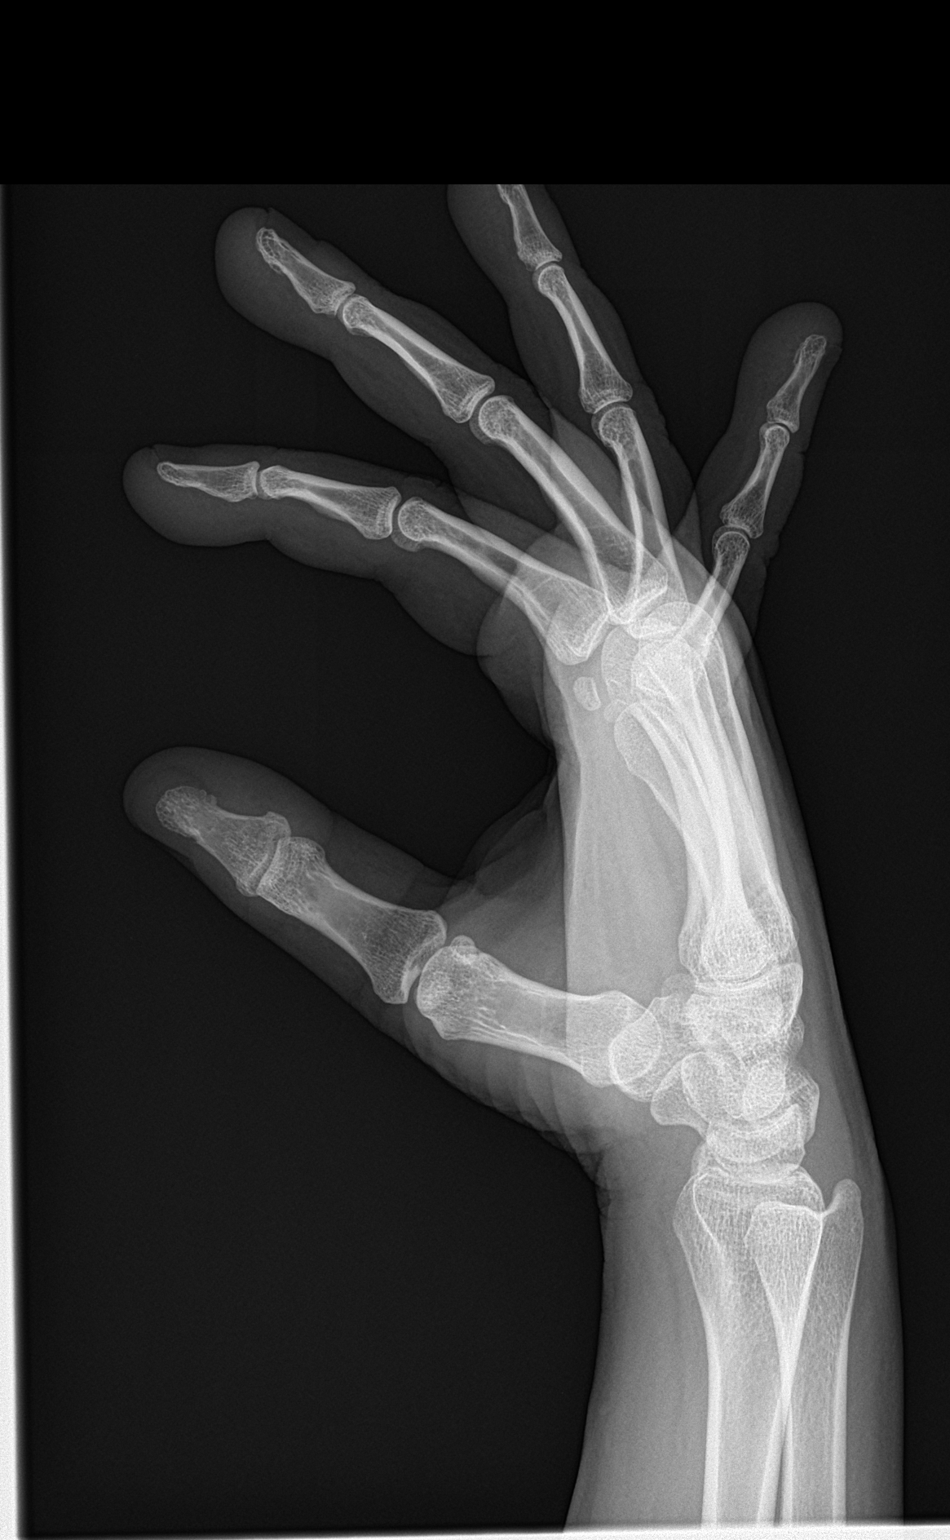

[3 of 3 positions shown; findings below may reference images not displayed]

FINDINGS: The right radiocarpal joint space is unremarkable and the ulnar
styloid is intact. They be very slight ulnar negative variance. MCP,
PIP, and DIP joints appear normal. No erosion is seen.
IMPRESSION: No acute abnormality.  Question mild ulnar negative variance.

## 2018-01-16 ENCOUNTER — Other Ambulatory Visit: Payer: Self-pay | Admitting: Family Medicine

## 2018-01-16 NOTE — Telephone Encounter (Signed)
Pt needs a refill   Topiramate 25 mg  Wendy Ferguson  Pt's call back is [phone] Thanks teri

## 2018-01-16 NOTE — Telephone Encounter (Signed)
Please advise patient she needs to get this refilled by her prescribing neurologist, Dr. Malvin Johns.

## 2018-01-18 NOTE — Telephone Encounter (Signed)
Patient was advised.  

## 2018-01-22 NOTE — Progress Notes (Signed)
Patient: Wendy Ferguson Female    DOB: 03-29-1981   37 y.o.   MRN: [id number] Visit Date: 01/23/2018  Today's Provider: [name], MD   Chief Complaint  Patient presents with  . Otalgia   Subjective:    Otalgia   There is pain in both ears. The current episode started in the past 7 days. There has been no fever. Associated symptoms include coughing, headaches, rhinorrhea and a sore throat. Pertinent negatives include no abdominal pain, diarrhea, ear discharge, hearing loss, neck pain, rash or vomiting. She has tried acetaminophen for the symptoms. The treatment provided mild relief.   Patient states she went swimming 1 week ago and it felt like she got water in her ears. Patient states since then she has had bilateral ear pain and congestion. Patient also states she has symptoms of headache, sore throat, cough, runny nose, shortness of breath and wheezing. Patient has been taking otc tylenol with relief.    Allergies  Allergen Reactions  . Bee Venom     Other reaction(s): Other (See Comments) Chest pain, sweating, fever  . Tylenol  [Acetaminophen]   . Adhesive [Tape] Rash    With certain bandaids cause a rash  . Erythromycin Rash     Current Outpatient Medications:  .  acetaminophen (TYLENOL) 325 MG tablet, Take 650 mg by mouth as needed., Disp: , Rfl:  .  aspirin EC 325 MG tablet, Take 1 tablet (325 mg total) by mouth daily., Disp: 45 tablet, Rfl: 0 .  divalproex (DEPAKOTE) 500 MG DR tablet, Take 1 tablet (500 mg total) by mouth 3 (three) times daily., Disp: 90 tablet, Rfl: 12 .  meloxicam (MOBIC) 15 MG tablet, Take 1 tablet (15 mg total) by mouth daily., Disp: 30 tablet, Rfl: 0 .  topiramate (TOPAMAX) 50 MG tablet, Take 100 mg by mouth daily., Disp: , Rfl:  .  phenytoin (DILANTIN) 100 MG ER capsule, Take 2 capsules by mouth. every morning , then take 2 capsules at night, Disp: , (Patient not taking) .  Vitamin D, Ergocalciferol, (DRISDOL) 50000 units CAPS capsule,  TAKE ONE CAPSULE EVERY WEEK (Patient not taking: Reported on 04/25/2017), Disp: 12 capsule, Rfl: 4  Review of Systems  Constitutional: Negative for appetite change, chills, fatigue and fever.  HENT: Positive for congestion, ear pain, rhinorrhea, sore throat and trouble swallowing. Negative for ear discharge, hearing loss, sinus pressure and sinus pain.   Respiratory: Positive for cough and wheezing. Negative for chest tightness and shortness of breath.   Cardiovascular: Negative for chest pain and palpitations.  Gastrointestinal: Negative for abdominal pain, diarrhea, nausea and vomiting.  Musculoskeletal: Negative for neck pain.  Skin: Negative for rash.  Neurological: Positive for headaches. Negative for dizziness and weakness.    Social History   Tobacco Use  . Smoking status: Former Smoker    Packs/day: 1.00    Years: 13.00    Pack years: 13.00    Types: Cigarettes    Last attempt to quit: 09/02/2007    Years since quitting: 10.4  . Smokeless tobacco: Never Used  Substance Use Topics  . Alcohol use: No    Alcohol/week: 0.0 oz   Objective:   BP 94/60 (BP Location: Right Arm, Patient Position: Sitting, Cuff Size: Large)   Pulse 72   Temp 98.4 F (36.9 C) (Oral)   Resp 16   Wt 189 lb (85.7 kg)   SpO2 97%   BMI 31.45 kg/m  Vitals:   01/23/18  1052  BP: 94/60  Pulse: 72  Resp: 16  Temp: 98.4 F (36.9 C)  TempSrc: Oral  SpO2: 97%  Weight: 189 lb (85.7 kg)     Physical Exam  General Appearance:    Alert, cooperative, no distress  HENT:   bilateral TM normal without fluid or infection, neck without nodes, pharynx erythematous without exudate, maxillary sinus tender and nasal mucosa pale and congested  Eyes:    PERRL, conjunctiva/corneas clear, EOM's intact       Lungs:     Clear to auscultation bilaterally, respirations unlabored  Heart:    Regular rate and rhythm  Neurologic:   Awake, alert, oriented x 3. No apparent focal neurological           defect.            Assessment & Plan:     1. Acute recurrent sinusitis, unspecified location  - fluticasone (FLONASE) 50 MCG/ACT nasal spray; Place 2 sprays into both nostrils daily.  Dispense: 16 g; Refill: 6 - amoxicillin (AMOXIL) 500 MG capsule; Take 2 capsules (1,000 mg total) by mouth 2 (two) times daily for 7 days.  Dispense: 28 capsule; Refill: 0  Call if symptoms change or if not rapidly improving.          Mila Merryonald Lorenz Donley, MD  [facility]

## 2018-01-23 ENCOUNTER — Ambulatory Visit (INDEPENDENT_AMBULATORY_CARE_PROVIDER_SITE_OTHER): Payer: Medicaid Other | Admitting: Family Medicine

## 2018-01-23 ENCOUNTER — Encounter: Payer: Self-pay | Admitting: Family Medicine

## 2018-01-23 VITALS — BP 94/60 | HR 72 | Temp 98.4°F | Resp 16 | Wt 189.0 lb

## 2018-01-23 DIAGNOSIS — J0191 Acute recurrent sinusitis, unspecified: Secondary | ICD-10-CM

## 2018-01-23 MED ORDER — FLUTICASONE PROPIONATE 50 MCG/ACT NA SUSP: 2.0000 | Freq: Every day | NASAL | 6 refills | Status: DC

## 2018-01-23 MED ORDER — AMOXICILLIN 500 MG PO CAPS: 1000.0000 mg | ORAL_CAPSULE | Freq: Two times a day (BID) | ORAL | 0 refills | Status: AC

## 2019-03-14 ENCOUNTER — Telehealth: Payer: Self-pay | Admitting: Family Medicine

## 2019-03-14 DIAGNOSIS — Z803 Family history of malignant neoplasm of breast: Secondary | ICD-10-CM

## 2019-03-14 NOTE — Telephone Encounter (Signed)
Pt needs to have an order sent to [facility] for her mammogram.  She called [place] to make an appt and they told her to have her primary send in an order    Con [place]

## 2019-03-15 NOTE — Telephone Encounter (Signed)
Tried calling patient. Left message to call back. 

## 2019-03-15 NOTE — Telephone Encounter (Signed)
Per patient no masses, soreness, or lumps. She reports that she just need a routine mammogram because of her family history.Mother with breast cancer and younger sister with breast cancer. Reports that her last mamogram was 2014.

## 2019-03-15 NOTE — Telephone Encounter (Signed)
Does she have breast soreness, lumps, or masses?

## 2019-03-26 NOTE — Telephone Encounter (Signed)
Pt returned missed call. Please call pt back asap. ° °Thanks, °[facility] °

## 2019-09-18 ENCOUNTER — Telehealth: Payer: Self-pay

## 2019-09-18 NOTE — Telephone Encounter (Signed)
Copied from CRM [id number]. Topic: General - Inquiry >> Sep 18, 2019 12:30 PM Leary Roca wrote: Reason for CRM: patient is needing a letter to keep with  her to have her dog as an emotional support system . Please reach out to patient >> Sep 18, 2019  1:24 PM Crist Infante wrote: Pt following up on on letter for emotional support dog

## 2019-09-20 ENCOUNTER — Telehealth: Payer: Self-pay

## 2019-09-20 NOTE — Telephone Encounter (Signed)
Patient called to check the status of her letter for her dog.  Please advise and call patient as soon as possible to to confirm at [phone]

## 2019-09-20 NOTE — Telephone Encounter (Signed)
Please advise letter? 

## 2019-09-20 NOTE — Telephone Encounter (Signed)
Copied from CRM [id number]. Topic: General - Inquiry >> Sep 18, 2019 12:30 PM Leary Roca wrote: Reason for CRM: patient is needing a letter to keep with  her to have her dog as an emotional support system . Please reach out to patient >> Sep 20, 2019  9:29 AM Tamela Oddi wrote: Patient is calling to follow up on the letter she needs for her support dog.  Please advise and call patient to update her on [phone] >> Sep 18, 2019  1:24 PM Crist Infante wrote: Pt following up on on letter for emotional support dog

## 2019-09-26 NOTE — Telephone Encounter (Addendum)
Pt following up on request for letter ie: emotional support dog  Pt needs the letter so she can take him into stores with her. The dog is a Insurance underwriter.

## 2019-10-07 NOTE — Telephone Encounter (Signed)
Patient is calling again in response to a letter she is requesting to have for her support dog so she can go into stores with her dog.  Patient requested this a few weeks ago and has not heard anything yet.  Please respond and call patient to approve at [phone]

## 2019-12-26 ENCOUNTER — Telehealth (INDEPENDENT_AMBULATORY_CARE_PROVIDER_SITE_OTHER): Payer: Medicaid Other | Admitting: Physician Assistant

## 2019-12-26 DIAGNOSIS — Z9109 Other allergy status, other than to drugs and biological substances: Secondary | ICD-10-CM | POA: Diagnosis not present

## 2019-12-26 MED ORDER — LEVOCETIRIZINE DIHYDROCHLORIDE 5 MG PO TABS: 5.0000 mg | ORAL_TABLET | Freq: Every evening | ORAL | 0 refills | Status: DC

## 2019-12-26 NOTE — Progress Notes (Signed)
Virtual telephone visit    Virtual Visit via Telephone Note   This visit type was conducted due to national recommendations for restrictions regarding the COVID-19 Pandemic (e.g. social distancing) in an effort to limit this patient's exposure and mitigate transmission in our community. Due to her co-morbid illnesses, this patient is at least at moderate risk for complications without adequate follow up. This format is felt to be most appropriate for this patient at this time. The patient did not have access to video technology or had technical difficulties with video requiring transitioning to audio format only (telephone). Physical exam was limited to content and character of the telephone converstion.    Patient location: Home Provider location: Office    Visit Date: 12/26/2019  Today's healthcare provider: Trinna Post, PA-C   Chief Complaint  Patient presents with  . Tickle in throat   Subjective    HPI   Patient complains of a tickle sensation in her throat for the past 3 days. This feeling worsens when she takes a deep breath. Patient states the sensation in her throat makes her feel that she needs to cough. She denies any fever, shortness of breath, sore throat, or headache. She has not tried any medications to help relieve symptoms. She reports she has a dry cough which will relieve the sensation but it quickly returns.       Medications: Outpatient Medications Prior to Visit  Medication Sig  . acetaminophen (TYLENOL) 325 MG tablet Take 650 mg by mouth as needed.  . divalproex (DEPAKOTE) 500 MG DR tablet Take 1 tablet by mouth in the morning, at noon, and at bedtime.  . fluticasone (FLONASE) 50 MCG/ACT nasal spray Place 2 sprays into both nostrils daily.  Marland Kitchen topiramate (TOPAMAX) 50 MG tablet Take 100 mg by mouth daily.  Marland Kitchen aspirin EC 325 MG tablet Take 1 tablet (325 mg total) by mouth daily. (Patient not taking: Reported on 12/26/2019)  . Vitamin D, Ergocalciferol,  (DRISDOL) 50000 units CAPS capsule TAKE ONE CAPSULE EVERY WEEK (Patient not taking: Reported on 04/25/2017)  . [DISCONTINUED] divalproex (DEPAKOTE) 500 MG DR tablet Take 1 tablet (500 mg total) by mouth 3 (three) times daily. (Patient not taking: Reported on 12/26/2019)  . [DISCONTINUED] meloxicam (MOBIC) 15 MG tablet Take 1 tablet (15 mg total) by mouth daily. (Patient not taking: Reported on 12/26/2019)  . [DISCONTINUED] phenytoin (DILANTIN) 100 MG ER capsule Take 2 capsules by mouth. every morning , then take 2 capsules at night   No facility-administered medications prior to visit.    Review of Systems  Constitutional: Negative for appetite change, chills, fatigue and fever.  HENT: Negative for congestion, dental problem, drooling, ear discharge, facial swelling, hearing loss, mouth sores, nosebleeds, postnasal drip, rhinorrhea, sinus pain, sneezing, sore throat, tinnitus, trouble swallowing and voice change.        Tickle sensation in her throat  Eyes: Positive for discharge (watery eyes).  Respiratory: Negative for chest tightness and shortness of breath.   Cardiovascular: Negative for chest pain and palpitations.  Gastrointestinal: Negative for abdominal pain, nausea and vomiting.  Neurological: Negative for dizziness and weakness.      Objective    There were no vitals taken for this visit.     Assessment & Plan    1. Environmental allergies  - levocetirizine (XYZAL) 5 MG tablet; Take 1 tablet (5 mg total) by mouth every evening.  Dispense: 90 tablet; Refill: 0  F/u PRN  I discussed the assessment and  treatment plan with the patient. The patient was provided an opportunity to ask questions and all were answered. The patient agreed with the plan and demonstrated an understanding of the instructions.   The patient was advised to call back or seek an in-person evaluation if the symptoms worsen or if the condition fails to improve as anticipated.  I provided 15 minutes of  non-face-to-face time during this encounter.  ITrey Sailors, PA-C, have reviewed all documentation for this visit. The documentation on 12/26/19 for the exam, diagnosis, procedures, and orders are all accurate and complete.   Maryella Shivers [facility] [phone] (phone) [phone] (fax)  [facility]

## 2020-01-03 ENCOUNTER — Ambulatory Visit: Payer: Self-pay

## 2020-01-03 NOTE — Telephone Encounter (Signed)
Patient called and says she's started with pain to her right side around to her back and up towards her underarm that occurred a couple of hours ago when she reached to pull a seatbelt around. She says the pain was a 7 when it happened, but now she's not hurting. She says it hurts now when she turns to the left side. She says when she's hurting, she lay on her affected side and it goes away. She denies coughing, but says 2 days ago she was coughing with the sinus infection she had. She says she doesn't have any symptoms of a sinus infection now, but she clears her throat a lot. She denies any urinary problems, no diarrhea, no constipation, no fever, no chills. I advised per protocol to see a provider within 24 hours, she asks for a virtual/phone visit on Monday with her provider. No availability for virtual with Dr. Sherrie Mustache until Friday according to the schedule, she asks to be seen by Osvaldo Angst. Appointment scheduled for Monday, 01/06/20 at 1440 with Osvaldo Angst, PA-C. Care advice given, advised to go to UC or ED if symptoms worsen over the weekend, she verbalized understanding.  Reason for Disposition . [1] MODERATE pain (e.g., interferes with normal activities) AND [2] pain comes and goes (cramps) AND [3] present > 24 hours  (Exception: pain with Vomiting or Diarrhea - see that Guideline)  Answer Assessment - Initial Assessment Questions 1. LOCATION: "Where does it hurt?"      Right side to the back and up towards the underarm 2. RADIATION: "Does the pain shoot anywhere else?" (e.g., chest, back)     No 3. ONSET: "When did the pain begin?" (e.g., minutes, hours or days ago)      Today a couple of hours ago 4. SUDDEN: "Gradual or sudden onset?"     Yes 5. PATTERN "Does the pain come and go, or is it constant?"    - If constant: "Is it getting better, staying the same, or worsening?"      (Note: Constant means the pain never goes away completely; most serious pain is constant and it  progresses)     - If intermittent: "How long does it last?" "Do you have pain now?"     (Note: Intermittent means the pain goes away completely between bouts)     Depends on how I twist around and turn. It goes away if I lay on that side 6. SEVERITY: "How bad is the pain?"  (e.g., Scale 1-10; mild, moderate, or severe)   - MILD (1-3): doesn't interfere with normal activities, abdomen soft and not tender to touch    - MODERATE (4-7): interferes with normal activities or awakens from sleep, tender to touch    - SEVERE (8-10): excruciating pain, doubled over, unable to do any normal activities      7 7. RECURRENT SYMPTOM: "Have you ever had this type of abdominal pain before?" If so, ask: "When was the last time?" and "What happened that time?"      No. Worse than a menstrual pain 8. CAUSE: "What do you think is causing the abdominal pain?"     Maybe a pulled muscle reaching for the seatbelt. Coughing a lot when I had the sinus infection 2 days ago, not coughing now. 9. RELIEVING/AGGRAVATING FACTORS: "What makes it better or worse?" (e.g., movement, antacids, bowel movement)     Twisted on the side it's hurting makes it better; twisting the opposite way makes it worse 10. OTHER  SYMPTOMS: "Has there been any vomiting, diarrhea, constipation, or urine problems?"       No 11. PREGNANCY: "Is there any chance you are pregnant?" "When was your last menstrual period?"       No  Protocols used: ABDOMINAL PAIN - [facility]

## 2020-01-04 DIAGNOSIS — Z90711 Acquired absence of uterus with remaining cervical stump: Secondary | ICD-10-CM | POA: Diagnosis not present

## 2020-01-04 DIAGNOSIS — B9689 Other specified bacterial agents as the cause of diseases classified elsewhere: Secondary | ICD-10-CM | POA: Diagnosis not present

## 2020-01-04 DIAGNOSIS — R197 Diarrhea, unspecified: Secondary | ICD-10-CM | POA: Diagnosis not present

## 2020-01-04 DIAGNOSIS — N760 Acute vaginitis: Secondary | ICD-10-CM | POA: Diagnosis not present

## 2020-01-04 DIAGNOSIS — J329 Chronic sinusitis, unspecified: Secondary | ICD-10-CM | POA: Diagnosis not present

## 2020-01-04 DIAGNOSIS — R1031 Right lower quadrant pain: Secondary | ICD-10-CM | POA: Diagnosis not present

## 2020-01-04 DIAGNOSIS — Z87891 Personal history of nicotine dependence: Secondary | ICD-10-CM | POA: Diagnosis not present

## 2020-01-04 DIAGNOSIS — R569 Unspecified convulsions: Secondary | ICD-10-CM | POA: Diagnosis not present

## 2020-01-04 DIAGNOSIS — R109 Unspecified abdominal pain: Secondary | ICD-10-CM | POA: Diagnosis not present

## 2020-01-06 ENCOUNTER — Encounter: Payer: Self-pay | Admitting: Physician Assistant

## 2020-01-06 ENCOUNTER — Ambulatory Visit (INDEPENDENT_AMBULATORY_CARE_PROVIDER_SITE_OTHER): Payer: Medicaid Other | Admitting: Physician Assistant

## 2020-01-06 VITALS — Temp 98.2°F | Ht 66.0 in | Wt 229.4 lb

## 2020-01-06 DIAGNOSIS — N760 Acute vaginitis: Secondary | ICD-10-CM | POA: Diagnosis not present

## 2020-01-06 DIAGNOSIS — E559 Vitamin D deficiency, unspecified: Secondary | ICD-10-CM | POA: Diagnosis not present

## 2020-01-06 DIAGNOSIS — J0191 Acute recurrent sinusitis, unspecified: Secondary | ICD-10-CM

## 2020-01-06 DIAGNOSIS — B9689 Other specified bacterial agents as the cause of diseases classified elsewhere: Secondary | ICD-10-CM | POA: Diagnosis not present

## 2020-01-06 MED ORDER — VITAMIN D (ERGOCALCIFEROL) 1.25 MG (50000 UNIT) PO CAPS: ORAL_CAPSULE | ORAL | 0 refills | Status: DC

## 2020-01-06 MED ORDER — METRONIDAZOLE 500 MG PO TABS: 500.0000 mg | ORAL_TABLET | Freq: Two times a day (BID) | ORAL | 0 refills | Status: AC

## 2020-01-06 MED ORDER — FLUTICASONE PROPIONATE 50 MCG/ACT NA SUSP: 2.0000 | Freq: Every day | NASAL | 6 refills | Status: DC

## 2020-01-06 MED ORDER — IOHEXOL 350 MG/ML SOLN
100.00 | INTRAVENOUS | Status: DC
Start: ? — End: 2020-01-06

## 2020-01-06 MED ORDER — KETOROLAC TROMETHAMINE 15 MG/ML IJ SOLN
15.00 | INTRAMUSCULAR | Status: DC
Start: ? — End: 2020-01-06

## 2020-01-06 NOTE — Patient Instructions (Signed)
Pelvic Pain, Female Pelvic pain is pain in your lower belly (abdomen), below your belly button and between your hips. The pain may start suddenly (be acute), keep coming back (be recurring), or last a long time (become chronic). Pelvic pain that lasts longer than 6 months is called chronic pelvic pain. There are many causes of pelvic pain. Sometimes the cause of pelvic pain is not known. Follow these instructions at home:   Take over-the-counter and prescription medicines only as told by your doctor.  Rest as told by your doctor.  Do not have sex if it hurts.  Keep a journal of your pelvic pain. Write down: ? When the pain started. ? Where the pain is located. ? What seems to make the pain better or worse, such as food or your period (menstrual cycle). ? Any symptoms you have along with the pain.  Keep all follow-up visits as told by your doctor. This is important. Contact a doctor if:  Medicine does not help your pain.  Your pain comes back.  You have new symptoms.  You have unusual discharge or bleeding from your vagina.  You have a fever or chills.  You are having trouble pooping (constipation).  You have blood in your pee (urine) or poop (stool).  Your pee smells bad.  You feel weak or light-headed. Get help right away if:  You have sudden pain that is very bad.  Your pain keeps getting worse.  You have very bad pain and also have any of these symptoms: ? A fever. ? Feeling sick to your stomach (nausea). ? Throwing up (vomiting). ? Being very sweaty.  You pass out (lose consciousness). Summary  Pelvic pain is pain in your lower belly (abdomen), below your belly button and between your hips.  There are many possible causes of pelvic pain.  Keep a journal of your pelvic pain. This information is not intended to replace advice given to you by your health care provider. Make sure you discuss any questions you have with your health care provider. Document  Revised: 01/24/2018 Document Reviewed: 01/24/2018 Elsevier Patient Education  2020 Elsevier Inc.  

## 2020-01-06 NOTE — Progress Notes (Signed)
Established patient visit   Patient: Wendy Ferguson   DOB: 1981/02/13   39 y.o. Female  MRN: [id number] Visit Date: 01/06/2020  Today's healthcare provider: Trinna Post, PA-C   Chief Complaint  Patient presents with  . Flank Pain   Subjective    Flank Pain This is a new problem. The current episode started in the past 7 days (Reports this started on Friday with an ache.). The problem is unchanged. The quality of the pain is described as aching. The pain does not radiate. The pain is at a severity of 6/10. Exacerbated by: movement. Pertinent negatives include no abdominal pain, leg pain, numbness, pelvic pain, tingling or weakness. She has tried nothing for the symptoms.    She went to the ED for this same pain. She was diagnosed with bacterial Vaginosis. Reports that she was prescribed Flagyl 500 mg tablet but she has not been able to go to the pharmacy because she doesn't have gas. She had an abdominal CT scan on 01/04/2020 which was unremarkable except for some stranding around the vaginal cuff indicating possible vaginitis. BV swab positive, trichomonas negative. Gonorrhea and chlamydia pending. She reports she hasn't been sexually active in 7 years. Reports her pain is actually mproving. Denies fevers, chills, nausea, vomiting, vaginal discharge.   Need refill for Vitamin D and Flonase.     Medications: Outpatient Medications Prior to Visit  Medication Sig  . acetaminophen (TYLENOL) 325 MG tablet Take 650 mg by mouth as needed.  . divalproex (DEPAKOTE) 500 MG DR tablet Take 1 tablet by mouth in the morning, at noon, and at bedtime.  Marland Kitchen levocetirizine (XYZAL) 5 MG tablet Take 1 tablet (5 mg total) by mouth every evening.  Marland Kitchen aspirin EC 325 MG tablet Take 1 tablet (325 mg total) by mouth daily. (Patient not taking: Reported on 12/26/2019)  . fluticasone (FLONASE) 50 MCG/ACT nasal spray Place 2 sprays into both nostrils daily. (Patient not taking: Reported on 01/06/2020)  .  topiramate (TOPAMAX) 50 MG tablet Take 100 mg by mouth daily.  . Vitamin D, Ergocalciferol, (DRISDOL) 50000 units CAPS capsule TAKE ONE CAPSULE EVERY WEEK (Patient not taking: Reported on 04/25/2017)   No facility-administered medications prior to visit.    Review of Systems  Gastrointestinal: Negative for abdominal pain.  Genitourinary: Positive for flank pain. Negative for pelvic pain.  Neurological: Negative for tingling, weakness and numbness.      Objective    Temp 98.2 F (36.8 C)   Ht 5\' 6"  (1.676 m)   Wt 229 lb 6.4 oz (104.1 kg)   BMI 37.03 kg/m    Physical Exam    No results found for any visits on 01/06/20.  Assessment & Plan    1. Bacterial vaginitis  Unusual for BV to cause so much pelvic pain, but could possibly contribute to PID. Would be more concerned about gonorrhea and chlamydia. Patient denies sexual activity x 7 years. She would like to wait for results of STI testing. Send in medication as below which will deliver it to her house. Advised her to notify us if her gonorrhea and chlamydia tests come back positive, we will have to treat those.   - metroNIDAZOLE (FLAGYL) 500 MG tablet; Take 1 tablet (500 mg total) by mouth 2 (two) times daily for 7 days.  Dispense: 14 tablet; Refill: 0  2. Acute recurrent sinusitis, unspecified location  - fluticasone (FLONASE) 50 MCG/ACT nasal spray; Place 2 sprays into both nostrils daily.  Dispense: 16 g; Refill: 6  3. Vitamin D deficiency  - Vitamin D, Ergocalciferol, (DRISDOL) 1.25 MG (50000 UNIT) CAPS capsule; TAKE ONE CAPSULE EVERY WEEK  Dispense: 12 capsule; Refill: 0   F/u PRN     I, Trey Sailors, PA-C, have reviewed all documentation for this visit. The documentation on 01/06/20 for the exam, diagnosis, procedures, and orders are all accurate and complete.    Maryella Shivers  [facility] [phone] (phone) [phone] (fax)  [facility]

## 2020-01-13 ENCOUNTER — Telehealth: Payer: Self-pay

## 2020-01-13 NOTE — Telephone Encounter (Signed)
Copied from CRM [id number]. Topic: General - Other >> Jan 13, 2020  3:27 PM Wendy Ferguson, [place] C wrote: Reason for CRM: pt called in to schedule a ED follow up. Pt says that she seen Vernell Morgans and is still having the same side pain pt says that she will be completing her medication tomorrow. Pt would like to know if her PCP could see her sooner than his next available if possible?   Please assist with scheduling

## 2020-01-14 NOTE — Telephone Encounter (Signed)
Soonest appt I have available is June 2 at 9:40. If she needs to be seen sooner she will need to see a PA.

## 2020-01-15 NOTE — Telephone Encounter (Signed)
Dr Sherrie Mustache [id number] was not avail. Pt request virtual appt. Pt is sch with adriana on 01-16-2020

## 2020-01-15 NOTE — Telephone Encounter (Signed)
Called to schedule appt for pt. No answer. Left message to return call. At the time of call Dr. Sherrie Mustache had an 11:20am slot open. If pt returns calls please schedule with Dr. Sherrie Mustache if he still has an open appt if not pt can see a PA or FNP. Thanks TNP

## 2020-01-16 ENCOUNTER — Telehealth (INDEPENDENT_AMBULATORY_CARE_PROVIDER_SITE_OTHER): Payer: Medicaid Other | Admitting: Physician Assistant

## 2020-01-16 ENCOUNTER — Ambulatory Visit: Payer: Medicaid Other | Admitting: Physician Assistant

## 2020-01-16 DIAGNOSIS — R197 Diarrhea, unspecified: Secondary | ICD-10-CM | POA: Diagnosis not present

## 2020-01-16 DIAGNOSIS — R109 Unspecified abdominal pain: Secondary | ICD-10-CM | POA: Diagnosis not present

## 2020-01-16 NOTE — Progress Notes (Signed)
MyChart Video Visit    Virtual Visit via Video Note   This visit type was conducted due to national recommendations for restrictions regarding the COVID-19 Pandemic (e.g. social distancing) in an effort to limit this patient's exposure and mitigate transmission in our community. This patient is at least at moderate risk for complications without adequate follow up. This format is felt to be most appropriate for this patient at this time. Physical exam was limited by quality of the video and audio technology used for the visit.   Patient location: Home Provider location: Office   Patient: Wendy Ferguson   DOB: Jun 18, 1981   39 y.o. Female  MRN: [id number] Visit Date: 01/16/2020  Today's healthcare provider: Trey Sailors, PA-C   Chief Complaint  Patient presents with  . Follow-up   Subjective    HPI Follow up for Right side pain:  The patient was last seen for this 11 days ago. Changes made at last visit include started Flaygl.  She reports good compliance with treatment. She feels that condition is Unchanged. She is not having side effects.   Patient reports continued abdominal pain and green stools. Reports she is having watery runny stools 6-7 per day for the past two days. Denies any other antibiotics besides flagyl. Reports gonorrhea and chlamdyia are negative. Reports abdominal pain as if she has a hole in her stomach. She denies fevers, chills. Has some nausea but no vomiting.  -----------------------------------------------------------------------------------------     Medications: Outpatient Medications Prior to Visit  Medication Sig  . acetaminophen (TYLENOL) 325 MG tablet Take 650 mg by mouth as needed.  . divalproex (DEPAKOTE) 500 MG DR tablet Take 1 tablet by mouth in the morning, at noon, and at bedtime.  . fluticasone (FLONASE) 50 MCG/ACT nasal spray Place 2 sprays into both nostrils daily.  Marland Kitchen levocetirizine (XYZAL) 5 MG tablet Take 1 tablet (5 mg  total) by mouth every evening.  . topiramate (TOPAMAX) 50 MG tablet Take 100 mg by mouth daily.  . Vitamin D, Ergocalciferol, (DRISDOL) 1.25 MG (50000 UNIT) CAPS capsule TAKE ONE CAPSULE EVERY WEEK   No facility-administered medications prior to visit.    Review of Systems  Constitutional: Negative.   Respiratory: Negative.   Cardiovascular: Negative.   Gastrointestinal: Positive for abdominal pain and diarrhea.  Musculoskeletal: Negative.       Objective    There were no vitals taken for this visit.   Physical Exam Constitutional:      Appearance: Normal appearance.  Neurological:     Mental Status: She is alert.        Assessment & Plan    1. Abdominal pain, unspecified abdominal location  Explained to patient it is very difficult for me to evaluate this virtually and she would likely need to come by and get some tests. DDx: colitis, particularly c diff colitis, side effects from antibiotics. Counseled I could empirically start vancomycin for c diff however I would hesistate to do so without any positive testing due to it being a broad spectrum antibiotic. She reports she is unable to come by the clinic for bloodwork or stool sample because she does not have transportation. Advised if she gets worse she should be seen at urgent care or ER. She is agreeable.   - GI Profile, Stool, PCR - Comprehensive Metabolic Panel (CMET) - CBC with Differential - C-reactive protein  2. Diarrhea, unspecified type  - GI Profile, Stool, PCR - Comprehensive Metabolic Panel (CMET) - CBC with  Differential - C-reactive protein   I discussed the assessment and treatment plan with the patient. The patient was provided an opportunity to ask questions and all were answered. The patient agreed with the plan and demonstrated an understanding of the instructions.   The patient was advised to call back or seek an in-person evaluation if the symptoms worsen or if the condition fails to improve  as anticipated.  I provided 25 minutes of non-face-to-face time during this encounter.  ITrinna Post, PA-C, have reviewed all documentation for this visit. The documentation on 01/16/20 for the exam, diagnosis, procedures, and orders are all accurate and complete.   Paulene Floor [facility] [phone] (phone) [phone] (fax)  [place]

## 2020-05-25 ENCOUNTER — Telehealth: Payer: Self-pay

## 2020-05-25 NOTE — Telephone Encounter (Signed)
Copied from CRM [id number]. Topic: General - Other >> May 25, 2020  2:39 PM Elliot Gault wrote: Patient requesting a service dog Dr. Phoebe Sharps. Patient states her apartment complex is requiring a letter in writing. Patient states her dog tag reflects "service seizure alert dog". Patient states  her dog also helps with her anxiety. Please mail letter to    [address]  [place] [address]

## 2020-06-11 NOTE — Telephone Encounter (Signed)
Patient called again today to check on the status of her letter for her apartment complex. She needs to know when the letter will be mailed out. She can be reached at [phone]. Please advise

## 2020-09-02 DIAGNOSIS — R519 Headache, unspecified: Secondary | ICD-10-CM | POA: Diagnosis not present

## 2020-09-02 DIAGNOSIS — M25561 Pain in right knee: Secondary | ICD-10-CM | POA: Diagnosis not present

## 2020-09-02 DIAGNOSIS — G479 Sleep disorder, unspecified: Secondary | ICD-10-CM | POA: Diagnosis not present

## 2020-09-02 DIAGNOSIS — R569 Unspecified convulsions: Secondary | ICD-10-CM | POA: Diagnosis not present

## 2020-10-23 ENCOUNTER — Other Ambulatory Visit: Payer: Self-pay

## 2020-10-23 ENCOUNTER — Encounter: Payer: Self-pay | Admitting: Family Medicine

## 2020-10-23 ENCOUNTER — Ambulatory Visit (INDEPENDENT_AMBULATORY_CARE_PROVIDER_SITE_OTHER): Payer: Medicaid Other | Admitting: Family Medicine

## 2020-10-23 VITALS — BP 105/72 | HR 81 | Temp 97.9°F | Resp 16 | Wt 227.0 lb

## 2020-10-23 DIAGNOSIS — F419 Anxiety disorder, unspecified: Secondary | ICD-10-CM | POA: Diagnosis not present

## 2020-10-23 DIAGNOSIS — G40909 Epilepsy, unspecified, not intractable, without status epilepticus: Secondary | ICD-10-CM

## 2020-10-23 DIAGNOSIS — B354 Tinea corporis: Secondary | ICD-10-CM | POA: Diagnosis not present

## 2020-10-23 MED ORDER — KETOCONAZOLE 2 % EX CREA: 1.0000 "application " | TOPICAL_CREAM | Freq: Every day | CUTANEOUS | 2 refills | Status: DC

## 2020-10-23 NOTE — Progress Notes (Signed)
Established patient visit   Patient: Wendy Ferguson   DOB: [birth year]   40 y.o. Female  MRN: [id number] Visit Date: 10/23/2020  Today's healthcare provider: [name], MD   Chief Complaint  Patient presents with  . Rash   Subjective    Rash This is a new problem. The current episode started yesterday. The problem is unchanged. The affected locations include the face. The rash is characterized by redness. Associated with: her dog currently has worms. Pertinent negatives include no fatigue, fever, shortness of breath or vomiting. Past treatments include nothing.    She also has long history of anxiety prescribed medications in the past including citalopram which she did not tolerated. However she does have a chihuahua that she has had for several years and she finds her anxiety is much better in the presence of her dog. However her landlord requires an extra fee for pets so patient is requesting a letter stating that she needs her dog as an emotional support animal.   and service dog.      Medications: Outpatient Medications Prior to Visit  Medication Sig  . acetaminophen (TYLENOL) 325 MG tablet Take 650 mg by mouth as needed.  . divalproex (DEPAKOTE) 500 MG DR tablet Take 1 tablet by mouth in the morning, at noon, and at bedtime.  . Multiple Vitamin (MULTIVITAMIN PO) Take by mouth.  . fluticasone (FLONASE) 50 MCG/ACT nasal spray Place 2 sprays into both nostrils daily. (Patient not taking: Reported on 10/23/2020)  . levocetirizine (XYZAL) 5 MG tablet Take 1 tablet (5 mg total) by mouth every evening. (Patient not taking: Reported on 10/23/2020)  . [DISCONTINUED] topiramate (TOPAMAX) 50 MG tablet Take 100 mg by mouth daily.  . [DISCONTINUED] Vitamin D, Ergocalciferol, (DRISDOL) 1.25 MG (50000 UNIT) CAPS capsule TAKE ONE CAPSULE EVERY WEEK (Patient not taking: Reported on 10/23/2020)   No facility-administered medications prior to visit.    Review of Systems  Constitutional:  Negative for appetite change, chills, fatigue and fever.  Respiratory: Negative for chest tightness and shortness of breath.   Cardiovascular: Negative for chest pain and palpitations.  Gastrointestinal: Negative for abdominal pain, nausea and vomiting.  Skin: Positive for rash (left side of face).  Neurological: Negative for dizziness and weakness.       Objective    BP 105/72 (BP Location: Right Arm, Patient Position: Sitting, Cuff Size: Large)   Pulse 81   Temp 97.9 F (36.6 C) (Temporal)   Resp 16   Wt 227 lb (103 kg)   BMI 36.64 kg/m     Physical Exam   About 1.2cm ring of erythema left ventral scalp c/w tinea.     Assessment & Plan     1. Tinea corporis  - ketoconazole (NIZORAL) 2 % cream; Apply 1 application topically daily. To affected area  Dispense: 15 g; Refill: 2  2. Anxiety Intolerant to SSRIs, but comforted by her per chihuahua. At her request will compose statement that the the dog is an effective emotional support animal.   3. Seizure disorder She also states that dog senses when she is going to have a seizure. I'm not sure how plausible that is, but her seizure disorder is managed by Dr. Malvin Johns and will defer any statements regarding management of seizures to him.   She declined flu vaccine.      The entirety of the information documented in the History of Present Illness, Review of Systems and Physical Exam were personally obtained by  me. Portions of this information were initially documented by the CMA and reviewed by me for thoroughness and accuracy.      Lelon Huh, MD  [facility] [phone] (phone) [phone] (fax)  [place]

## 2020-10-26 ENCOUNTER — Telehealth: Payer: Self-pay | Admitting: Family Medicine

## 2020-10-26 NOTE — Telephone Encounter (Signed)
Pt is calling to request a letter stating that her dog Insurance account manager) is an emotional support dog. Patient is able to pick up the letter. Please advise CB- [phone]

## 2020-10-30 NOTE — Telephone Encounter (Signed)
Pt calling again for an update. Please advise.

## 2020-10-30 NOTE — Telephone Encounter (Signed)
Patient advised that Dr. Sherrie Mustache is out of the office today. Will route message back  to Dr. Sherrie Mustache to address once he returns.

## 2020-11-02 NOTE — Telephone Encounter (Signed)
Pt states that she "really needs this note very badly" also that she has been waiting for over two weeks. She would like a phone cal once the letter is complete so she can come pick it up from the office.  Best contact: [phone]

## 2020-11-27 ENCOUNTER — Telehealth: Payer: Medicaid Other | Admitting: Family Medicine

## 2020-12-03 DIAGNOSIS — G4489 Other headache syndrome: Secondary | ICD-10-CM | POA: Diagnosis not present

## 2020-12-03 DIAGNOSIS — F418 Other specified anxiety disorders: Secondary | ICD-10-CM | POA: Diagnosis not present

## 2020-12-03 DIAGNOSIS — R569 Unspecified convulsions: Secondary | ICD-10-CM | POA: Diagnosis not present

## 2020-12-03 DIAGNOSIS — G478 Other sleep disorders: Secondary | ICD-10-CM | POA: Diagnosis not present

## 2021-03-30 ENCOUNTER — Telehealth (INDEPENDENT_AMBULATORY_CARE_PROVIDER_SITE_OTHER): Payer: Medicaid Other | Admitting: Family Medicine

## 2021-03-30 ENCOUNTER — Encounter: Payer: Self-pay | Admitting: Family Medicine

## 2021-03-30 DIAGNOSIS — L819 Disorder of pigmentation, unspecified: Secondary | ICD-10-CM

## 2021-03-30 DIAGNOSIS — F5101 Primary insomnia: Secondary | ICD-10-CM

## 2021-03-30 MED ORDER — KETOCONAZOLE 2 % EX CREA: 1.0000 "application " | TOPICAL_CREAM | Freq: Every day | CUTANEOUS | 1 refills | Status: DC

## 2021-03-30 MED ORDER — MELATONIN 5 MG PO TABS: 2.5000 mg | ORAL_TABLET | Freq: Every day | ORAL | 0 refills | Status: DC

## 2021-03-30 NOTE — Progress Notes (Signed)
MyChart Video Visit    Virtual Visit via Video Note   This visit type was conducted due to national recommendations for restrictions regarding the COVID-19 Pandemic (e.g. social distancing) in an effort to limit this patient's exposure and mitigate transmission in our community. This patient is at least at moderate risk for complications without adequate follow up. This format is felt to be most appropriate for this patient at this time. Physical exam was limited by quality of the video and audio technology used for the visit.   Patient location: home Provider location: [facility]  I discussed the limitations of evaluation and management by telemedicine and the availability of in person appointments. The patient expressed understanding and agreed to proceed.  Patient: Wendy Ferguson   DOB: November 07, 1980   40 y.o. Female  MRN: [id number] Visit Date: 03/30/2021  Today's healthcare provider: [name], MD   Chief Complaint  Patient presents with   Skin Discoloration   Insomnia   Subjective    HPI  Skin Discoloration: Patient complains of white spots on her left cheek and left ear. She first noticed the spots 3-4 weeks ago. She reports no change in size or appearance. Patient denies any pain or itching.   Insomnia: Patient complains of difficulty falling asleep and staying asleep. She is interested in taking Melatonin. She is requesting a prescription to help her sleep at night. Has also been prescribed neurontin and topamax prescribed by Dr. Malvin Johns.   Medications: Outpatient Medications Prior to Visit  Medication Sig   acetaminophen (TYLENOL) 325 MG tablet Take 650 mg by mouth as needed.   divalproex (DEPAKOTE) 500 MG DR tablet Take 1 tablet by mouth in the morning, at noon, and at bedtime.   fluticasone (FLONASE) 50 MCG/ACT nasal spray Place 2 sprays into both nostrils daily.   ketoconazole (NIZORAL) 2 % cream Apply 1 application topically daily. To affected area   Multiple  Vitamin (MULTIVITAMIN PO) Take by mouth.   levocetirizine (XYZAL) 5 MG tablet Take 1 tablet (5 mg total) by mouth every evening. (Patient not taking: Reported on 03/30/2021)   No facility-administered medications prior to visit.    Review of Systems  Constitutional:  Negative for appetite change, chills, fatigue and fever.  Respiratory:  Negative for chest tightness and shortness of breath.   Cardiovascular:  Negative for chest pain and palpitations.  Gastrointestinal:  Negative for abdominal pain, nausea and vomiting.  Skin:  Positive for color change.  Neurological:  Negative for dizziness and weakness.  Psychiatric/Behavioral:  Positive for sleep disturbance.      Objective    There were no vitals taken for this visit.   Physical Exam   Irregular patches of hypopigmentation on both checks just below ears.    Assessment & Plan     1. Primary insomnia  - melatonin 5 MG TABS; Take 0.5-2 tablets (2.5-10 mg total) by mouth at bedtime.  Dispense: 30 tablet; Refill: 0  2. Discoloration of skin of face Limited exam due to being a virtual visit. Suspect vitiligo versus tinea versicolor. Will try - ketoconazole (NIZORAL) 2 % cream; Apply 1 application topically daily. To affected area  Dispense: 15 g; Refill: 1   She is to call for dermatology referral if not much better in 3-4 weeks.      I discussed the assessment and treatment plan with the patient. The patient was provided an opportunity to ask questions and all were answered. The patient agreed with the plan and demonstrated  an understanding of the instructions.   The patient was advised to call back or seek an in-person evaluation if the symptoms worsen or if the condition fails to improve as anticipated.  I provided 11 minutes of non-face-to-face time during this encounter.  The entirety of the information documented in the History of Present Illness, Review of Systems and Physical Exam were personally obtained by me.  Portions of this information were initially documented by the CMA and reviewed by me for thoroughness and accuracy.    [name], MD [facility] [phone] (phone) [phone] (fax)  [facility]

## 2021-03-30 NOTE — Patient Instructions (Signed)
.   Please review the attached list of medications and notify my office if there are any errors.   . Please bring all of your medications to every appointment so we can make sure that our medication list is the same as yours.   

## 2021-04-01 ENCOUNTER — Telehealth: Payer: Self-pay | Admitting: Family Medicine

## 2021-04-01 DIAGNOSIS — L819 Disorder of pigmentation, unspecified: Secondary | ICD-10-CM

## 2021-04-01 NOTE — Telephone Encounter (Signed)
Please advise 

## 2021-04-01 NOTE — Telephone Encounter (Signed)
Patient wanted to inform PCP she had an allergic reaction from ketoconazole (NIZORAL) 2 % cream in the past, cream made her skin peel. Patient requesting alternate. Informed patient PCP is out of the office today, patient would like a follow up call at [phone] [facility])

## 2021-04-02 NOTE — Telephone Encounter (Signed)
Patient was advised.  

## 2021-04-30 NOTE — Telephone Encounter (Signed)
No need to follow up here. Can cancel appt. Referral to dermatology signed.

## 2021-04-30 NOTE — Telephone Encounter (Signed)
Patient had a video visit on 03/30/2021 for discoloration of skin of face and was prescribed Ketoconazole cream. Patient tried using the cream with no relief. She called our office on 04/01/2021 and Dr. Sullivan Lone recommended she try using OTC selsun blue shampoo applied to the area for about 5 minutes then rinse nightly for a couple of weeks. Patient is calling back today stating that the discoloration is not any better. She says she has tried both the cream and Selsun blue with no improvement.  Per office note from 03/30/2021, we would refer to Dermatology if no better. Patient advised of this and agrees to dermatology referral. I have placed an referral order in this encounter. Patient has an appointment scheduled with Dr. Sherrie Mustache on 06/14/2021 to follow up on this skin problem. Patient wants to know if she needs to keep this appointment since she is being referred to dermatology?

## 2021-04-30 NOTE — Telephone Encounter (Signed)
L/M advising patient of this. Ok for [facility] to advise if pt calls back.

## 2021-04-30 NOTE — Telephone Encounter (Signed)
Pt called in stating those creams are still not working for her skin.

## 2021-04-30 NOTE — Addendum Note (Signed)
Addended by: Awilda Bill L on: 04/30/2021 11:15 AM   Modules accepted: Orders

## 2021-04-30 NOTE — Addendum Note (Signed)
Addended by: Malva Limes on: 04/30/2021 01:38 PM   Modules accepted: Orders

## 2021-05-06 ENCOUNTER — Ambulatory Visit: Payer: Medicaid Other | Admitting: Family Medicine

## 2021-05-12 ENCOUNTER — Ambulatory Visit: Payer: Self-pay | Admitting: *Deleted

## 2021-05-12 NOTE — Telephone Encounter (Signed)
Needs office visit to evaluated.

## 2021-05-12 NOTE — Telephone Encounter (Signed)
Copied from CRM [id number]. Topic: General - Other >> May 12, 2021 11:13 AM Jaquita Rector A wrote: Reason for CRM: Patient called in to inform Dr Sherrie Mustache that she had a crying spell on the night of 05/11/21 say that she had an extreme amount of pain needing to know if there is a medication to help. Asking for a call back at  Ph#   [phone]

## 2021-05-12 NOTE — Telephone Encounter (Signed)
Reason for Disposition . [1] Caused by muscle cramps in the thigh, calf, or foot AND [2] present < 1 hour (brief, now gone)  Answer Assessment - Initial Assessment Questions 1. ONSET: "When did the pain start?"      2 nights ago 2. LOCATION: "Where is the pain located?"      Both calves 3. PAIN: "How bad is the pain?"    (Scale 1-10; or mild, moderate, severe)   -  MILD (1-3): doesn't interfere with normal activities    -  MODERATE (4-7): interferes with normal activities (e.g., work or school) or awakens from sleep, limping    -  SEVERE (8-10): excruciating pain, unable to do any normal activities, unable to walk     Awake all night 4. WORK OR EXERCISE: "Has there been any recent work or exercise that involved this part of the body?"      no 5. CAUSE: "What do you think is causing the leg pain?"     RLS 6. OTHER SYMPTOMS: "Do you have any other symptoms?" (e.g., chest pain, back pain, breathing difficulty, swelling, rash, fever, numbness, weakness)     no 7. PREGNANCY: "Is there any chance you are pregnant?" "When was your last menstrual period?"     no  Protocols used: Leg Pain-A-AH

## 2021-05-12 NOTE — Telephone Encounter (Addendum)
 ..  Per agent: "Patient called in to inform Dr Sherrie Mustache that she had a crying spell on the night of 05/11/21 say that she had an extreme amount of pain needing to know if there is a medication to help. Asking for a call back to discuss at  Ph#   [phone] ".  Pt reports bilateral calf pain last 2 nights. "I think my restless leg syndrome is worsening." Reports "Awake all night with pain, but walking did help." No pain/cramping presently, "Just at night." Denies any redness or warmth. Pt states had has in past and used "Good Sense Extra Strength Hot and Colds Patches." Asked to clarify, states "Feels cold when you put them on, then they get hot." States someone gave them to her. Reports OTC but would like prescription. States "Or another cream that may help, I don't want medication because I like to keep my blood clean." Assured pt NT would route to practice for PCPs review and final disposition.  Pt verbalizes understanding. Marland Kitchen..

## 2021-05-12 NOTE — Telephone Encounter (Signed)
Patient advised that she needs an appointment. We don't have any available appointments this week in the office. Patient wants to know if Dr. Sherrie Mustache can work her in on Friday. She requested that I send a message to Dr. Sherrie Mustache. I advised patient that there is no guarantee that she can be worked in since there are no available appointments. I recommended that she go to an urgent care for evaluation. Patient declined. Patient preferred to have a message sent to Dr. Sherrie Mustache to see if he would work her in. Please advise.

## 2021-06-14 ENCOUNTER — Ambulatory Visit: Payer: Medicaid Other | Admitting: Family Medicine

## 2021-09-08 ENCOUNTER — Ambulatory Visit: Payer: Medicaid Other | Admitting: Dermatology

## 2021-10-08 ENCOUNTER — Telehealth: Payer: Medicaid Other | Admitting: Family Medicine

## 2021-10-12 ENCOUNTER — Encounter: Payer: Self-pay | Admitting: Family Medicine

## 2021-10-12 ENCOUNTER — Ambulatory Visit
Admission: RE | Admit: 2021-10-12 | Discharge: 2021-10-12 | Disposition: A | Discharge status: Home / Self Care | Payer: Medicaid Other | Attending: Family Medicine | Admitting: Family Medicine

## 2021-10-12 ENCOUNTER — Other Ambulatory Visit: Payer: Self-pay

## 2021-10-12 ENCOUNTER — Ambulatory Visit (INDEPENDENT_AMBULATORY_CARE_PROVIDER_SITE_OTHER): Payer: Medicaid Other | Admitting: Family Medicine

## 2021-10-12 ENCOUNTER — Telehealth: Payer: Self-pay

## 2021-10-12 ENCOUNTER — Ambulatory Visit
Admission: RE | Admit: 2021-10-12 | Discharge: 2021-10-12 | Disposition: A | Discharge status: Home / Self Care | Payer: Medicaid Other | Source: Ambulatory Visit | Attending: Family Medicine | Admitting: Family Medicine

## 2021-10-12 VITALS — BP 94/61 | Temp 99.1°F | Resp 16 | Ht 66.0 in | Wt 227.1 lb

## 2021-10-12 DIAGNOSIS — R0609 Other forms of dyspnea: Secondary | ICD-10-CM

## 2021-10-12 DIAGNOSIS — M2550 Pain in unspecified joint: Secondary | ICD-10-CM

## 2021-10-12 NOTE — Telephone Encounter (Signed)
Patient advised no need to come back. We will call her with labs and imaging results.

## 2021-10-12 NOTE — Progress Notes (Signed)
°  ° ° °  Established patient visit   Patient: Wendy Ferguson   DOB: Sep 27, 1980   40 y.o. Female  MRN: [id number] Visit Date: 10/12/2021  Today's healthcare provider: [name], MD   No chief complaint on file.  Subjective    HPI  Patient coming in today with concerns of having trouble breathing with walking. She reports walking back from her mail box which is half mile from her house she is having trouble catching her breath.    Patient reports that her right hand wrist started hurting this past Friday . Reports that she had pain on her right ring finger and pinky. She also had another episode on Saturday on left hand and arm with pain and numbness, lost strength. She said she massage the palm of her hand and it went away.    Medications: Outpatient Medications Prior to Visit  Medication Sig   acetaminophen (TYLENOL) 325 MG tablet Take 650 mg by mouth as needed.   divalproex (DEPAKOTE) 500 MG DR tablet Take 1 tablet by mouth in the morning, at noon, and at bedtime.   ketoconazole (NIZORAL) 2 % cream Apply 1 application topically daily. To affected area   levocetirizine (XYZAL) 5 MG tablet Take 1 tablet (5 mg total) by mouth every evening.   melatonin 5 MG TABS Take 0.5-2 tablets (2.5-10 mg total) by mouth at bedtime.   Multiple Vitamin (MULTIVITAMIN PO) Take by mouth.   fluticasone (FLONASE) 50 MCG/ACT nasal spray Place 2 sprays into both nostrils daily.   No facility-administered medications prior to visit.        Objective    BP 94/61 (BP Location: Left Arm, Patient Position: Sitting, Cuff Size: Large)   Temp 99.1 F (37.3 C) (Oral)   Resp 16   Ht 5\' 6"  (1.676 m)   Wt 227 lb 1.6 oz (103 kg)   BMI 36.65 kg/m  {Show previous vital signs (optional):23777}  Physical Exam   General: Appearance:    Mildly obese female in no acute distress  Eyes:    PERRL, conjunctiva/corneas clear, EOM's intact       Lungs:     Clear to auscultation bilaterally, respirations  unlabored  Heart:    Normal heart rate. Normal rhythm. No murmurs, rubs, or gallops.    MS:   All extremities are intact.   Minimal tenderness and swelling of right wrist.   Neurologic:   Awake, alert, oriented x 3. No apparent focal neurological defect.        Assessment & Plan     1. Dyspnea on exertion  - CBC - Comprehensive metabolic panel - DG Chest 2 View; Future - EKG 12-Lead - TSH  2. Arthralgia, unspecified joint  - Sed Rate (ESR)      The entirety of the information documented in the History of Present Illness, Review of Systems and Physical Exam were personally obtained by me. Portions of this information were initially documented by the CMA and reviewed by me for thoroughness and accuracy.     [name], MD  [facility] [phone] (phone) [phone] (fax)  [facility]

## 2021-10-12 NOTE — Telephone Encounter (Signed)
Copied from CRM [id number]. Topic: General - Other >> Oct 12, 2021  1:00 PM McGill, Wendy Ferguson wrote: Reason for CRM: Pt stated PCP asked her to go have labs done and a chest scan. Pt unsure if PCP wanted her to come back and see him today?  Please advise.  Pt requesting call back.

## 2021-10-13 LAB — CBC
Hematocrit: 41.1 % (ref 34.0–46.6)
Hemoglobin: 12.8 g/dL (ref 11.1–15.9)
MCH: 25 pg — ABNORMAL LOW (ref 26.6–33.0)
MCHC: 31.1 g/dL — ABNORMAL LOW (ref 31.5–35.7)
MCV: 80 fL (ref 79–97)
Platelets: 323 10*3/uL (ref 150–450)
RBC: 5.13 x10E6/uL (ref 3.77–5.28)
RDW: 15.4 % (ref 11.7–15.4)
WBC: 7.3 10*3/uL (ref 3.4–10.8)

## 2021-10-13 LAB — TSH: TSH: 1.28 u[IU]/mL (ref 0.450–4.500)

## 2021-10-13 LAB — COMPREHENSIVE METABOLIC PANEL
ALT: 15 IU/L (ref 0–32)
AST: 13 IU/L (ref 0–40)
Albumin/Globulin Ratio: 1.7 (ref 1.2–2.2)
Albumin: 4.3 g/dL (ref 3.8–4.8)
Alkaline Phosphatase: 45 IU/L (ref 44–121)
BUN/Creatinine Ratio: 14 (ref 9–23)
BUN: 10 mg/dL (ref 6–24)
Bilirubin Total: 0.3 mg/dL (ref 0.0–1.2)
CO2: 23 mmol/L (ref 20–29)
Calcium: 9.2 mg/dL (ref 8.7–10.2)
Chloride: 104 mmol/L (ref 96–106)
Creatinine, Ser: 0.74 mg/dL (ref 0.57–1.00)
Globulin, Total: 2.6 g/dL (ref 1.5–4.5)
Glucose: 99 mg/dL (ref 70–99)
Potassium: 5 mmol/L (ref 3.5–5.2)
Sodium: 140 mmol/L (ref 134–144)
Total Protein: 6.9 g/dL (ref 6.0–8.5)
eGFR: 105 mL/min/{1.73_m2} (ref >59)

## 2021-10-13 LAB — SEDIMENTATION RATE: Sed Rate: 2 mm/hr (ref 0–32)

## 2021-11-18 ENCOUNTER — Ambulatory Visit: Payer: Medicaid Other | Admitting: Dermatology

## 2021-11-29 ENCOUNTER — Ambulatory Visit (INDEPENDENT_AMBULATORY_CARE_PROVIDER_SITE_OTHER): Payer: Medicaid Other | Admitting: Family Medicine

## 2021-11-29 ENCOUNTER — Encounter: Payer: Self-pay | Admitting: Family Medicine

## 2021-11-29 VITALS — BP 114/77 | HR 91 | Temp 97.9°F | Resp 16 | Wt 219.0 lb

## 2021-11-29 DIAGNOSIS — H1033 Unspecified acute conjunctivitis, bilateral: Secondary | ICD-10-CM

## 2021-11-29 MED ORDER — AZELASTINE HCL 0.05 % OP SOLN: 1.0000 [drp] | Freq: Two times a day (BID) | OPHTHALMIC | 12 refills | Status: DC

## 2021-11-29 MED ORDER — AMOXICILLIN 500 MG PO CAPS: 1000.0000 mg | ORAL_CAPSULE | Freq: Three times a day (TID) | ORAL | 0 refills | Status: AC
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
## 2021-11-30 ENCOUNTER — Telehealth: Payer: Self-pay

## 2021-11-30 NOTE — Telephone Encounter (Signed)
This is a patient of Dr Theodis Aguas, he is out for the rest of the week.  Can you help with this? ?Thanks ? ? ?Copied from CRM [id number]. Topic: Quick Communication - Rx Refill/Question ?>> Nov 30, 2021  4:23 PM Pawlus, Maxine Glenn A wrote: ?Pt stated she cannot afford the Rx azelastine (OPTIVAR) 0.05 % ophthalmic solution, pt wanted to know if an Rx can be sent in that is covered by Medicaid, please advise. ?

## 2021-12-02 ENCOUNTER — Other Ambulatory Visit: Payer: Self-pay | Admitting: Physician Assistant

## 2021-12-02 DIAGNOSIS — H1013 Acute atopic conjunctivitis, bilateral: Secondary | ICD-10-CM

## 2021-12-02 MED ORDER — KETOTIFEN FUMARATE 0.025 % OP SOLN: 1.0000 [drp] | Freq: Two times a day (BID) | OPHTHALMIC | 0 refills | Status: AC

## 2021-12-02 NOTE — Telephone Encounter (Signed)
Patient advised. She says the pharmacy checked and those prescriptions were not covered either. Patient is going to try the OTC versions.  ?

## 2021-12-13 ENCOUNTER — Telehealth: Payer: Self-pay

## 2021-12-13 NOTE — Telephone Encounter (Signed)
Need clarification on the initial message. Im not sure what the patient is requesting. Left message to call back. OK for [facility] triage to speak with patient.  ?

## 2021-12-13 NOTE — Telephone Encounter (Signed)
Copied from [place] [id number]. Topic: General - Other ?>> Dec 13, 2021  9:43 AM Valere Dross wrote: ?Reason for CRM: Pt called in stating the office [facility] is needing a order for adults diapers sent over to them, she stated they will be calling over as [facility], for a heads up, please advise. ?

## 2021-12-14 NOTE — Telephone Encounter (Signed)
Pt called in stating a Rx will be faxed over for protective underwear for bladder leakage, pt is requesting this be filled out and sent back so these can be sent directly to her.  ?

## 2021-12-20 ENCOUNTER — Encounter: Payer: Self-pay | Admitting: *Deleted

## 2021-12-20 NOTE — Telephone Encounter (Signed)
This encounter was created in error - please disregard.

## 2021-12-20 NOTE — Telephone Encounter (Signed)
Patient called to request order be written for incontinent aids for bladder leakage from PCP and sent to [facility] and they will send incontinent "diapers" to her at home. Initial request for 12/13/21. Please advise incontinent aids needed today . ?

## 2021-12-20 NOTE — Telephone Encounter (Signed)
Call was disconnected ?This encounter was created in error - please disregard. ?

## 2021-12-21 ENCOUNTER — Telehealth: Payer: Self-pay | Admitting: *Deleted

## 2021-12-21 NOTE — Telephone Encounter (Signed)
Copied from [place]. Topic: General - Other >> Dec 21, 2021  2:52 PM Yvette Rack wrote: Reason for CRM: Pt stated she has been waiting since 12/13/21 for her doctor to sign off on the request for incontinence supplies. Pt requests call back with update.

## 2021-12-27 ENCOUNTER — Telehealth: Payer: Self-pay | Admitting: Family Medicine

## 2021-12-27 NOTE — Telephone Encounter (Signed)
Pt received a jury summons today to appear February 01, 2022 at 8:30 am. ?Pt states she has seizures and appearing would riger a seizure. ?Pt would Dr Caryn Section to write a letter, and this time to permanently excuse her for life from a juror summons.. ? ?Juror  # [id number] ?File no: [id number] ? ?[address] in [place] ?[place], [place]  ? ?Email: [email or web address] ?Must email by 5 pm June 6 ? ?Pt would like a copy of the letter ?

## 2021-12-29 NOTE — Telephone Encounter (Signed)
Pt called and informed jury summons letter from Dr. Sherrie Mustache is ready for pick up at front desk.   ?

## 2021-12-29 NOTE — Telephone Encounter (Signed)
Pt called in stated will be picking up tomorrow.  ?

## 2022-04-08 ENCOUNTER — Emergency Department
Admission: EM | Admit: 2022-04-08 | Discharge: 2022-04-08 | Disposition: A | Discharge status: Home / Self Care | Payer: Medicaid Other | Attending: Emergency Medicine | Admitting: Emergency Medicine

## 2022-04-08 ENCOUNTER — Other Ambulatory Visit: Payer: Self-pay

## 2022-04-08 ENCOUNTER — Emergency Department: Payer: Medicaid Other

## 2022-04-08 DIAGNOSIS — Y92410 Unspecified street and highway as the place of occurrence of the external cause: Secondary | ICD-10-CM | POA: Insufficient documentation

## 2022-04-08 DIAGNOSIS — M25521 Pain in right elbow: Secondary | ICD-10-CM | POA: Diagnosis not present

## 2022-04-08 DIAGNOSIS — S6991XA Unspecified injury of right wrist, hand and finger(s), initial encounter: Secondary | ICD-10-CM | POA: Diagnosis present

## 2022-04-08 DIAGNOSIS — S52121A Displaced fracture of head of right radius, initial encounter for closed fracture: Secondary | ICD-10-CM | POA: Insufficient documentation

## 2022-04-08 MED ORDER — OXYCODONE HCL 5 MG PO TABS
5.0000 mg | ORAL_TABLET | Freq: Once | ORAL | Status: AC
  Administered 2022-04-08: 5 mg via ORAL
  Filled 2022-04-08: qty 1

## 2022-04-08 MED ORDER — IBUPROFEN 600 MG PO TABS
600.0000 mg | ORAL_TABLET | Freq: Once | ORAL | Status: AC
  Administered 2022-04-08: 600 mg via ORAL
  Filled 2022-04-08: qty 1
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
## 2022-04-08 NOTE — Discharge Instructions (Signed)
Keep your splint clean and dry.  Please return for any new, worsening, or change in symptoms or other concerns.  You may take Tylenol/ibuprofen per package instructions to help with your pain.  Please call orthopedics today to schedule an appointment in 1 week.
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
## 2022-04-11 ENCOUNTER — Ambulatory Visit (INDEPENDENT_AMBULATORY_CARE_PROVIDER_SITE_OTHER): Payer: Medicaid Other | Admitting: Family Medicine

## 2022-04-11 ENCOUNTER — Encounter: Payer: Self-pay | Admitting: Family Medicine

## 2022-04-11 VITALS — HR 89 | Temp 98.9°F | Wt 218.0 lb

## 2022-04-11 DIAGNOSIS — S52121A Displaced fracture of head of right radius, initial encounter for closed fracture: Secondary | ICD-10-CM

## 2022-04-11 MED ORDER — OXYCODONE-ACETAMINOPHEN 7.5-325 MG PO TABS: 1.0000 | ORAL_TABLET | Freq: Four times a day (QID) | ORAL | 0 refills | Status: AC | PRN
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
## 2022-04-19 IMAGING — CR DG CHEST 2V
1 series · 2 of 2 positions shown · non-contrast
Comparison: 02/01/2016

CLINICAL DATA: Shortness of breath for 1 week

EXAM:
CHEST - 2 VIEW

[Series 1: dg chest 2 view · 0.14mm/px · 2 of 2 slices shown]
[im 1/2]
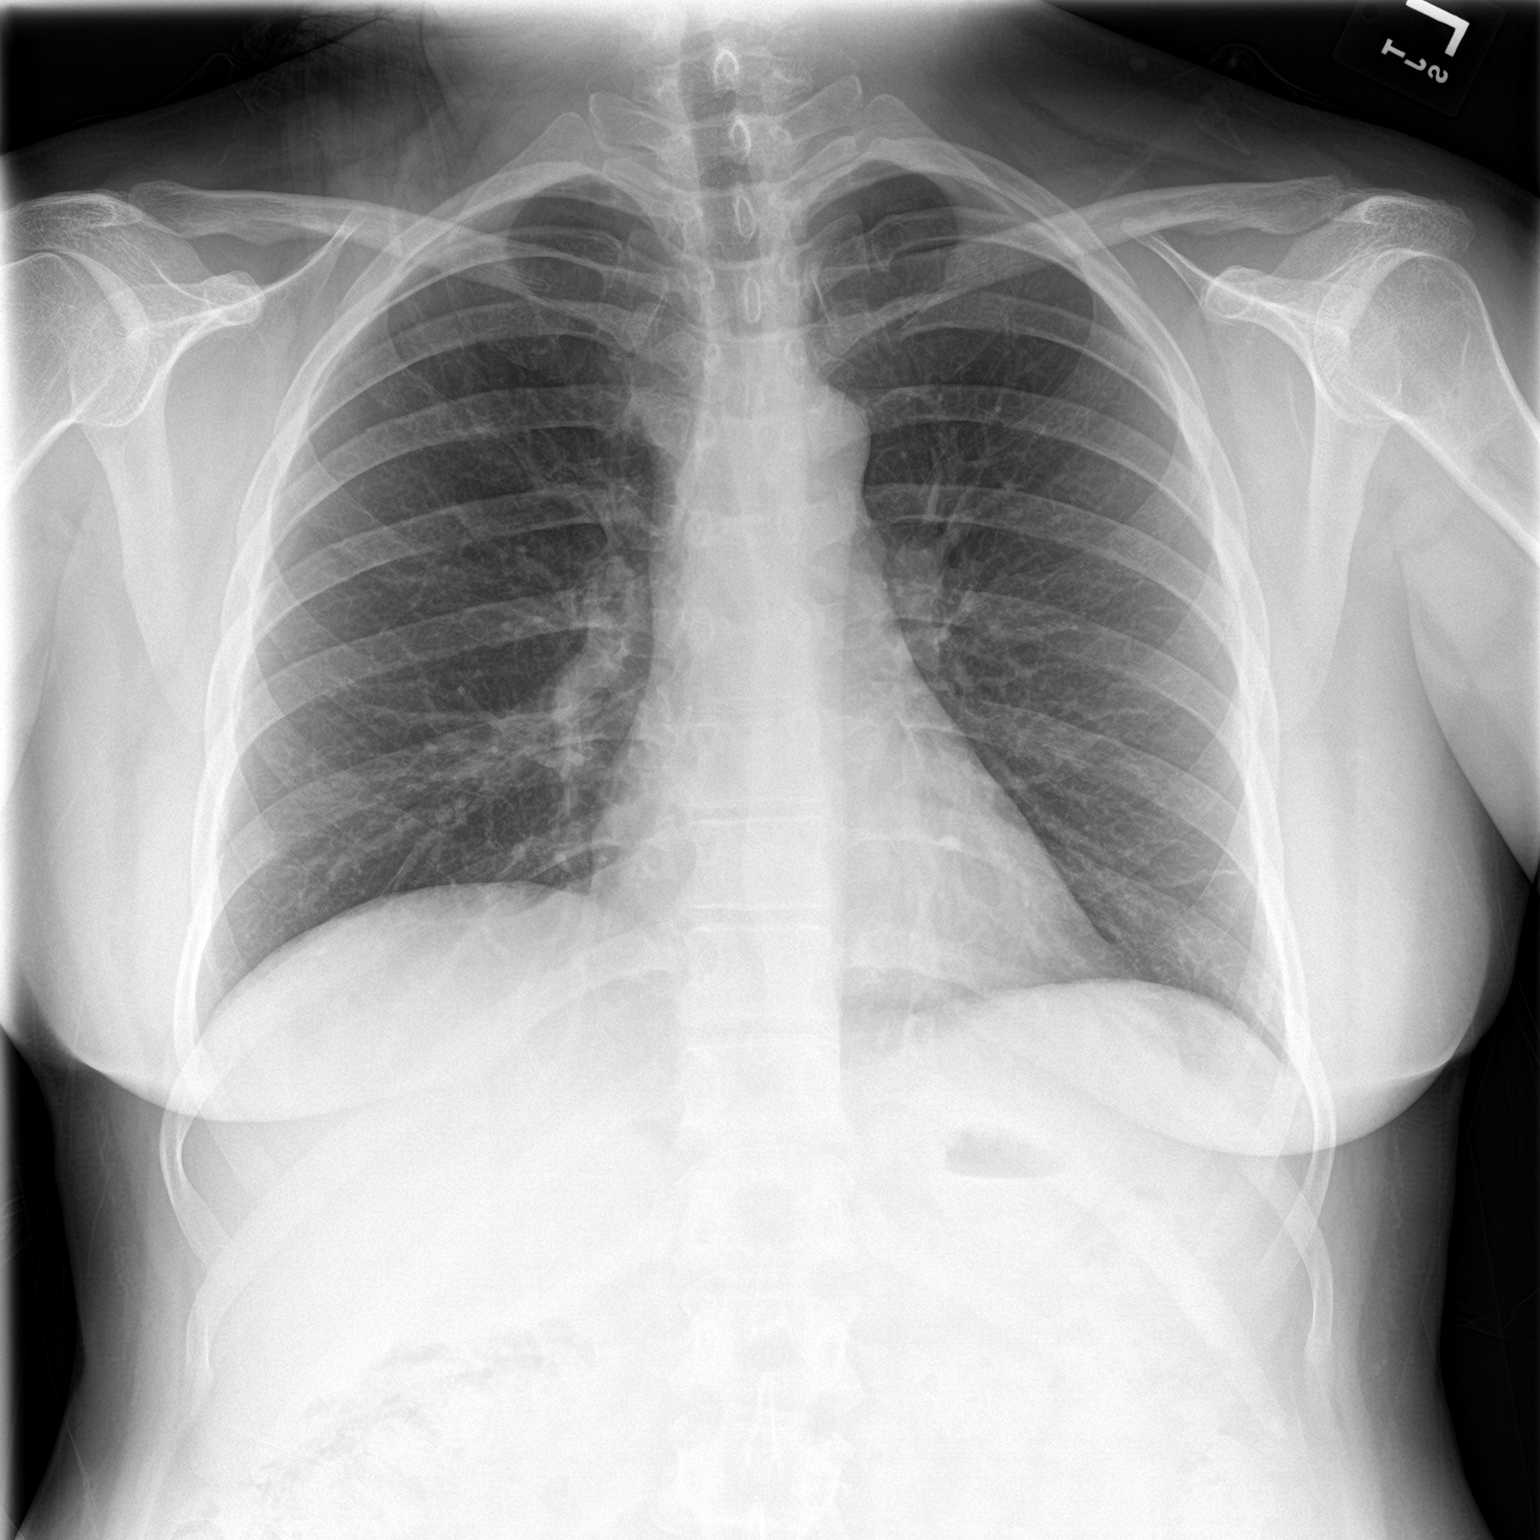
[im 2/2]
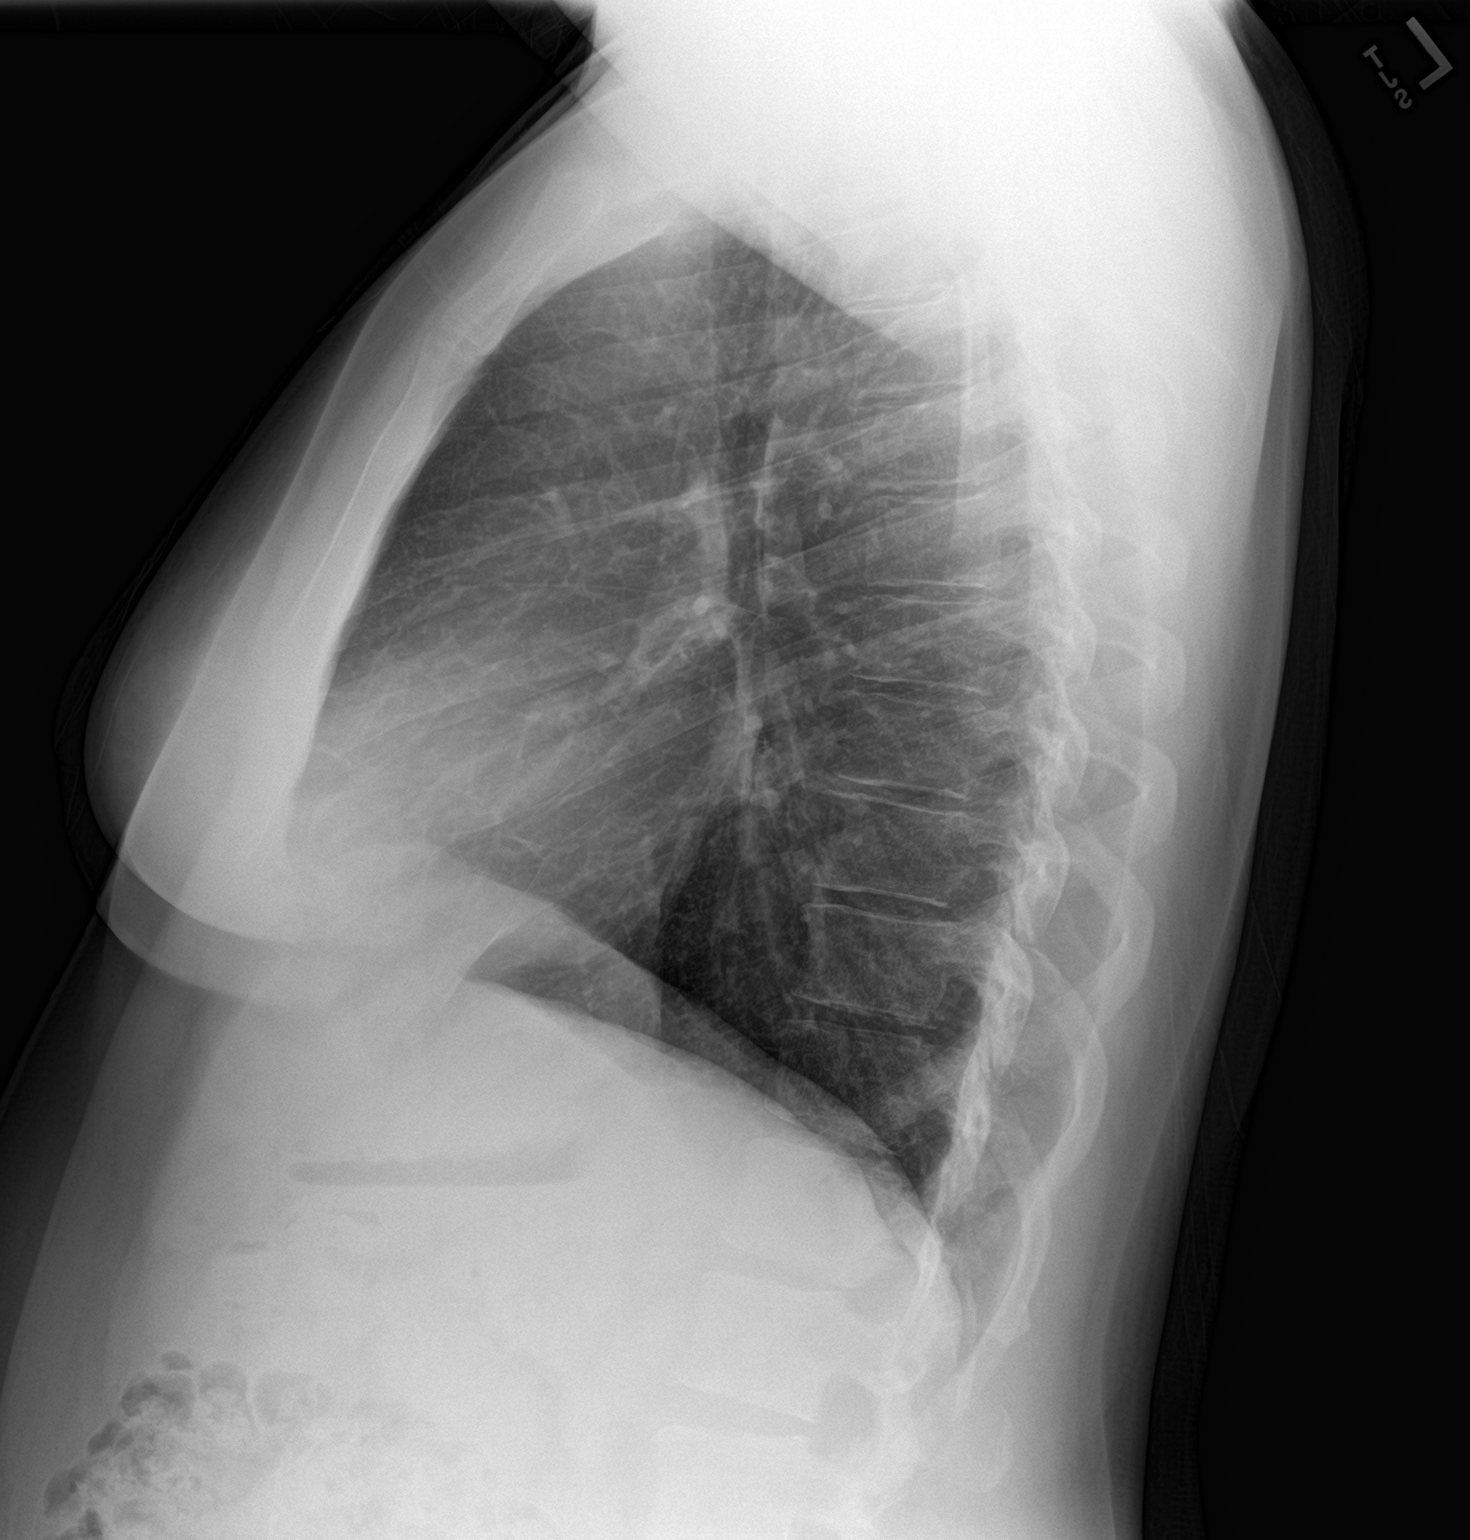

[2 of 2 positions shown; findings below may reference images not displayed]

FINDINGS: The heart size and mediastinal contours are within normal limits.
Both lungs are clear. The visualized skeletal structures are
unremarkable.
IMPRESSION: No active cardiopulmonary disease.

## 2022-04-20 ENCOUNTER — Ambulatory Visit: Payer: Medicaid Other | Admitting: Dermatology

## 2022-07-26 ENCOUNTER — Ambulatory Visit (INDEPENDENT_AMBULATORY_CARE_PROVIDER_SITE_OTHER): Payer: Medicaid Other | Admitting: Family Medicine

## 2022-07-26 ENCOUNTER — Other Ambulatory Visit (HOSPITAL_COMMUNITY)
Admission: RE | Admit: 2022-07-26 | Discharge: 2022-07-26 | Disposition: A | Discharge status: Home / Self Care | Payer: Medicaid Other | Source: Ambulatory Visit | Attending: Family Medicine | Admitting: Family Medicine

## 2022-07-26 ENCOUNTER — Encounter: Payer: Self-pay | Admitting: Family Medicine

## 2022-07-26 VITALS — BP 124/78 | HR 74 | Resp 14 | Ht 65.0 in | Wt 232.0 lb

## 2022-07-26 DIAGNOSIS — Z136 Encounter for screening for cardiovascular disorders: Secondary | ICD-10-CM | POA: Diagnosis not present

## 2022-07-26 DIAGNOSIS — Z124 Encounter for screening for malignant neoplasm of cervix: Secondary | ICD-10-CM

## 2022-07-26 DIAGNOSIS — Z1159 Encounter for screening for other viral diseases: Secondary | ICD-10-CM

## 2022-07-26 DIAGNOSIS — E559 Vitamin D deficiency, unspecified: Secondary | ICD-10-CM | POA: Diagnosis not present

## 2022-07-26 DIAGNOSIS — Z90711 Acquired absence of uterus with remaining cervical stump: Secondary | ICD-10-CM

## 2022-07-26 NOTE — Progress Notes (Signed)
I,[name],acting as a scribe for [name], MD.,have documented all relevant documentation on the behalf of [name], MD,as directed by  [name], MD while in the presence of [name], MD.   Complete physical exam   Patient: CHRISTEAN Ferguson   DOB: 22-Apr-1981   41 y.o. Female  MRN: [id number] Visit Date: 07/26/2022  Today's healthcare provider: [name], MD   Chief Complaint  Patient presents with   Annual Exam   Subjective    Wendy Ferguson is a 41 y.o. female who presents today for a complete physical exam.  She reports consuming a general diet. Home exercise routine includes walking. She generally feels well. She reports sleeping well. She does not have additional problems to discuss today.  .   Past Medical History:  Diagnosis Date   Complication of anesthesia 09/2012   pt had a seizure during partial hysterectomy surgery    Helicobacter pylori gastritis    Ovarian cyst    Seizures (HCC)    Past Surgical History:  Procedure Laterality Date   Abscess of ovary  08/23/2007   removed scar tissue   BRAIN SURGERY     to determine the cause of seizures.  plate put in head right forehead.   CESAREAN SECTION     KNEE ARTHROSCOPY Right 08/19/2015   Procedure: ARTHROSCOPY KNEE, LYSIS OF ADHESIONS AND PARTIAL SYNOVECTOMY;  Surgeon: Juanell Fairly, MD;  Location: [facility] ORS;  Service: Orthopedics;  Laterality: Right;   KNEE SURGERY Right 08/22/17   KNEE SURGERY Right 08/23/1987   Larey Seat while having seizer.    SUPRACERVICAL ABDOMINAL HYSTERECTOMY  08/29/2012   w left salpingectomy for chronic pelvic pain per [facility]   UNILATERAL SALPINGECTOMY Left 08/29/2012   for chronic pelvic pain per [facility]horn   Social History   Socioeconomic History   Marital status: Divorced    Spouse name: Not on file   Number of children: 1   Years of education: Not on file   Highest education level: Not on file  Occupational History   Occupation: Unemployed   Tobacco Use   Smoking status: Former    Packs/day: 1.00    Years: 13.00    Total pack years: 13.00    Types: Cigarettes    Quit date: 09/02/2007    Years since quitting: 14.9   Smokeless tobacco: Never  Substance and Sexual Activity   Alcohol use: No    Alcohol/week: 0.0 standard drinks of alcohol   Drug use: No   Sexual activity: Not on file  Other Topics Concern   Not on file  Social History Narrative   Not on file   Social Determinants of Health   Financial Resource Strain: Not on file  Food Insecurity: Not on file  Transportation Needs: Not on file  Physical Activity: Not on file  Stress: Not on file  Social Connections: Not on file  Intimate Partner Violence: Not on file   Family Status  Relation Name Status   Mother  Alive   Father  Alive       disabled in one arm   Sister  Alive   Brother  Alive   Sister  Alive   MGM  Deceased   MGF  Alive   PGM  Alive   PGF  Alive   Family History  Problem Relation Age of Onset   Breast cancer Mother    Skin cancer Mother    Allergies  Allergen Reactions   Bee Venom  Other reaction(s): Other (See Comments) Chest pain, sweating, fever   Citalopram Diarrhea and Nausea And Vomiting   Tylenol  [Acetaminophen]    Adhesive [Tape] Rash    With certain bandaids cause a rash   Erythromycin Rash    Patient Care Team: Malva Limes, MD as PCP - General (Family Medicine) Morene Crocker, MD as Referring Physician (Neurology)   Medications: Outpatient Medications Prior to Visit  Medication Sig   acetaminophen (TYLENOL) 325 MG tablet Take 650 mg by mouth as needed.   divalproex (DEPAKOTE) 500 MG DR tablet Take 1 tablet by mouth in the morning, at noon, and at bedtime.   levocetirizine (XYZAL) 5 MG tablet Take 1 tablet (5 mg total) by mouth every evening.   Multiple Vitamin (MULTIVITAMIN PO) Take by mouth.   No facility-administered medications prior to visit.    Review of Systems  Allergic/Immunologic:  Positive for environmental allergies.  Neurological:  Positive for seizures.  All other systems reviewed and are negative.     Objective    BP 124/78 (BP Location: Left Arm, Patient Position: Sitting, Cuff Size: Large)   Pulse 74   Resp 14   Ht 5\' 5"  (1.651 m)   Wt 232 lb (105.2 kg)   SpO2 98%   BMI 38.61 kg/m     Physical Exam   General Appearance:    Obese female. Alert, cooperative, in no acute distress, appears stated age   Head:    Normocephalic, without obvious abnormality, atraumatic  Eyes:    PERRL, conjunctiva/corneas clear, EOM's intact, fundi    benign, both eyes  Ears:    Normal TM's and external ear canals, both ears  Nose:   Nares normal, septum midline, mucosa normal, no drainage    or sinus tenderness  Throat:   Lips, mucosa, and tongue normal; teeth and gums normal  Neck:   Supple, symmetrical, trachea midline, no adenopathy;    thyroid:  no enlargement/tenderness/nodules; no carotid   bruit or JVD  Back:     Symmetric, no curvature, ROM normal, no CVA tenderness  Lungs:     Clear to auscultation bilaterally, respirations unlabored  Chest Wall:    No tenderness or deformity   Heart:    Normal heart rate. Normal rhythm. No murmurs, rubs, or gallops.   Breast Exam:    normal appearance, no masses or tenderness, Inspection negative  Abdomen:     Soft, non-tender, bowel sounds active all four quadrants,    no masses, no organomegaly  Pelvic:    cervix normal in appearance, external genitalia normal, and uterus surgically absent  Extremities:   All extremities are intact. No cyanosis or edema  Pulses:   2+ and symmetric all extremities  Skin:   Skin color, texture, turgor normal, no rashes or lesions  Lymph nodes:   Cervical, supraclavicular, and axillary nodes normal  Neurologic:   CNII-XII intact, normal strength, sensation and reflexes    throughout     Last depression screening scores    11/29/2021    2:50 PM 03/30/2021    3:12 PM 01/16/2020    9:52  AM  PHQ 2/9 Scores  PHQ - 2 Score 0 0 0  PHQ- 9 Score 4     Last fall risk screening    11/29/2021    2:50 PM  Fall Risk   Falls in the past year? 0  Number falls in past yr: 0  Injury with Fall? 0  Follow up Falls evaluation completed  Last Audit-C alcohol use screening    11/29/2021    2:51 PM  Alcohol Use Disorder Test (AUDIT)  1. How often do you have a drink containing alcohol? 0  3. How often do you have six or more drinks on one occasion? 0   A score of 3 or more in women, and 4 or more in men indicates increased risk for alcohol abuse, EXCEPT if all of the points are from question 1   No results found for any visits on 07/26/22.  Assessment & Plan    Routine Health Maintenance and Physical Exam  Exercise Activities and Dietary recommendations  Goals   None     Immunization History  Administered Date(s) Administered   Td 04/25/2017   Tdap 09/06/2006    Health Maintenance  Topic Date Due   COVID-19 Vaccine (1) Never done   Hepatitis C Screening  Never done   PAP SMEAR-Modifier  04/25/2020   INFLUENZA VACCINE  Never done   DTaP/Tdap/Td (3 - Td or Tdap) 04/26/2027   HIV Screening  Completed   HPV VACCINES  Aged Out    Discussed health benefits of physical activity, and encouraged her to engage in regular exercise appropriate for her age and condition.  1. Cervical cancer screening  - Cytology - HPV w/PAP any interp. Reflex 16/18 genotyping if HPV + ([facility])  2. Need for hepatitis C screening test  - Hepatitis C antibody  3. Vitamin D deficiency  - VITAMIN D 25 Hydroxy (Vit-D Deficiency, Fractures)  4. Encounter for special screening examination for cardiovascular disorder  - Lipid panel - Glucose  5. Morbid obesity (HCC)  - Comprehensive metabolic panel - TSH   She declined flu vaccine     The entirety of the information documented in the History of Present Illness, Review of Systems and Physical Exam were personally obtained by me.  Portions of this information were initially documented by the CMA and reviewed by me for thoroughness and accuracy.     [name], MD  [facility] [phone] (phone) [phone] (fax)  [facility]

## 2022-07-27 LAB — VITAMIN D 25 HYDROXY (VIT D DEFICIENCY, FRACTURES): Vit D, 25-Hydroxy: 24.7 ng/mL — ABNORMAL LOW (ref 30.0–100.0)

## 2022-07-27 LAB — COMPREHENSIVE METABOLIC PANEL
ALT: 13 IU/L (ref 0–32)
AST: 13 IU/L (ref 0–40)
Albumin/Globulin Ratio: 1.8 (ref 1.2–2.2)
Albumin: 4.4 g/dL (ref 3.9–4.9)
Alkaline Phosphatase: 47 IU/L (ref 44–121)
BUN/Creatinine Ratio: 10 (ref 9–23)
BUN: 8 mg/dL (ref 6–24)
Bilirubin Total: <0.2 mg/dL (ref 0.0–1.2)
CO2: 21 mmol/L (ref 20–29)
Calcium: 9.3 mg/dL (ref 8.7–10.2)
Chloride: 105 mmol/L (ref 96–106)
Creatinine, Ser: 0.81 mg/dL (ref 0.57–1.00)
Globulin, Total: 2.5 g/dL (ref 1.5–4.5)
Glucose: 96 mg/dL (ref 70–99)
Potassium: 4.9 mmol/L (ref 3.5–5.2)
Sodium: 140 mmol/L (ref 134–144)
Total Protein: 6.9 g/dL (ref 6.0–8.5)
eGFR: 93 mL/min/{1.73_m2} (ref >59)

## 2022-07-27 LAB — LIPID PANEL
Chol/HDL Ratio: 2.8 ratio (ref 0.0–4.4)
Cholesterol, Total: 173 mg/dL (ref 100–199)
HDL: 61 mg/dL (ref >39)
LDL Chol Calc (NIH): 95 mg/dL (ref 0–99)
Triglycerides: 95 mg/dL (ref 0–149)
VLDL Cholesterol Cal: 17 mg/dL (ref 5–40)

## 2022-07-27 LAB — TSH: TSH: 1.18 u[IU]/mL (ref 0.450–4.500)

## 2022-07-27 LAB — HEPATITIS C ANTIBODY: Hep C Virus Ab: NONREACTIVE

## 2022-07-28 ENCOUNTER — Encounter: Payer: Self-pay | Admitting: *Deleted

## 2022-07-28 LAB — CYTOLOGY - PAP
Adequacy: ABSENT
Comment: NEGATIVE
Diagnosis: NEGATIVE
High risk HPV: NEGATIVE

## 2022-09-06 ENCOUNTER — Telehealth: Payer: Self-pay | Admitting: Family Medicine

## 2022-09-06 DIAGNOSIS — E559 Vitamin D deficiency, unspecified: Secondary | ICD-10-CM

## 2022-09-06 DIAGNOSIS — G40909 Epilepsy, unspecified, not intractable, without status epilepticus: Secondary | ICD-10-CM

## 2022-09-06 NOTE — Telephone Encounter (Signed)
Patient advised. Verbalized understanding. Labs printed and at front desk

## 2022-09-06 NOTE — Telephone Encounter (Signed)
Please advise patient we received request from Dr. Melrose Nakayama to check valproic acid level. Also need to recheck vitamin d level since it was low. Please print order for lab, she does not have to be fasting.

## 2022-09-14 LAB — VALPROIC ACID LEVEL: Valproic Acid Lvl: 18 ug/mL — ABNORMAL LOW (ref 50–100)

## 2022-09-14 LAB — VITAMIN D 25 HYDROXY (VIT D DEFICIENCY, FRACTURES): Vit D, 25-Hydroxy: 18.5 ng/mL — ABNORMAL LOW (ref 30.0–100.0)

## 2022-09-14 NOTE — Progress Notes (Signed)
Notified pt with lab results and OTC medication. Pt voiced understanding.

## 2022-10-17 ENCOUNTER — Ambulatory Visit: Payer: Self-pay | Admitting: *Deleted

## 2022-10-17 ENCOUNTER — Ambulatory Visit (INDEPENDENT_AMBULATORY_CARE_PROVIDER_SITE_OTHER): Payer: Medicaid Other | Admitting: Family Medicine

## 2022-10-17 VITALS — BP 119/72 | HR 90 | Temp 98.6°F | Wt 247.0 lb

## 2022-10-17 DIAGNOSIS — R319 Hematuria, unspecified: Secondary | ICD-10-CM | POA: Diagnosis not present

## 2022-10-17 DIAGNOSIS — E559 Vitamin D deficiency, unspecified: Secondary | ICD-10-CM | POA: Diagnosis not present

## 2022-10-17 DIAGNOSIS — G40909 Epilepsy, unspecified, not intractable, without status epilepticus: Secondary | ICD-10-CM | POA: Diagnosis not present

## 2022-10-17 DIAGNOSIS — N390 Urinary tract infection, site not specified: Secondary | ICD-10-CM

## 2022-10-17 LAB — POCT URINALYSIS DIPSTICK
Bilirubin, UA: NEGATIVE
Glucose, UA: NEGATIVE
Ketones, UA: 15
Nitrite, UA: NEGATIVE
Protein, UA: POSITIVE — AB
Spec Grav, UA: 1.01 (ref 1.010–1.025)
Urobilinogen, UA: 0.2 E.U./dL
pH, UA: 7.5 (ref 5.0–8.0)

## 2022-10-17 MED ORDER — CEPHALEXIN 500 MG PO CAPS: 500.0000 mg | ORAL_CAPSULE | Freq: Four times a day (QID) | ORAL | 0 refills | Status: AC

## 2022-10-17 NOTE — Progress Notes (Signed)
Established patient visit   Patient: Wendy Ferguson   DOB: 1981-01-21   42 y.o. Female  MRN: [id number] Visit Date: 10/17/2022  Today's healthcare provider: Lelon Huh, MD   No chief complaint on file.  Subjective    HPI  Urinary Tract Infection Patient is a 42 year old female who presents for evaluation of possible urinary infection.  Patient states she started having symptoms yesterday.  She noticed blood in her urine last night but none this morning.  She continues today to have pressure, spasms and frequency.  Medications: Outpatient Medications Prior to Visit  Medication Sig   acetaminophen (TYLENOL) 325 MG tablet Take 650 mg by mouth as needed.   Cholecalciferol (VITAMIN D) 50 MCG (2000 UT) tablet Take 2,000 Units by mouth daily.   divalproex (DEPAKOTE) 500 MG DR tablet Take 1 tablet by mouth in the morning, at noon, and at bedtime.   levocetirizine (XYZAL) 5 MG tablet Take 1 tablet (5 mg total) by mouth every evening.   Multiple Vitamin (MULTIVITAMIN PO) Take by mouth.   No facility-administered medications prior to visit.    Review of Systems  Constitutional:  Negative for fever.  Genitourinary:  Positive for frequency and hematuria. Negative for difficulty urinating, dyspareunia and dysuria.       Objective    BP 119/72 (BP Location: Right Arm, Patient Position: Sitting, Cuff Size: Normal)   Pulse 90   Temp 98.6 F (37 C) (Oral)   Wt 247 lb (112 kg)   SpO2 100%   BMI 41.10 kg/m    Physical Exam   General: Appearance:    Obese female in no acute distress  Eyes:    PERRL, conjunctiva/corneas clear, EOM's intact       Lungs:     Clear to auscultation bilaterally, respirations unlabored  Heart:    Normal heart rate. Normal rhythm. No murmurs, rubs, or gallops.    MS:   All extremities are intact.    Neurologic:   Awake, alert, oriented x 3. No apparent focal neurological defect.         Results for orders placed or performed in visit on  10/17/22  POCT urinalysis dipstick  Result Value Ref Range   Color, UA yellow    Clarity, UA clear    Glucose, UA Negative Negative   Bilirubin, UA neg    Ketones, UA 15    Spec Grav, UA 1.010 1.010 - 1.025   Blood, UA 2+    pH, UA 7.5 5.0 - 8.0   Protein, UA Positive (A) Negative   Urobilinogen, UA 0.2 0.2 or 1.0 E.U./dL   Nitrite, UA neg    Leukocytes, UA Small (1+) (A) Negative   Appearance     Odor      Assessment & Plan     1. Urinary tract infection with hematuria, site unspecified  - Urine Culture - cephALEXin (KEFLEX) 500 MG capsule; Take 1 capsule (500 mg total) by mouth 4 (four) times daily for 5 days.  Dispense: 20 capsule; Refill: 0  2. Vitamin D deficiency Is now taking d3 supplement consistently every day.  - VITAMIN D 25 Hydroxy (Vit-D Deficiency, Fractures)  3. Seizure disorder ([place]) Doing well with increased dose of phenytoin.  - Dilantin (Phenytoin) level, total      The entirety of the information documented in the History of Present Illness, Review of Systems and Physical Exam were personally obtained by me. Portions of this information were  initially documented by the CMA and reviewed by me for thoroughness and accuracy.     Lelon Huh, MD  [place] [phone] (phone) [phone] (fax)  [place]

## 2022-10-17 NOTE — Telephone Encounter (Signed)
Reason for Disposition  Blood in urine  (Exception: Could be normal menstrual bleeding.)  Answer Assessment - Initial Assessment Questions 1. COLOR of URINE: "Describe the color of the urine."  (e.g., tea-colored, pink, red, bloody) "Do you have blood clots in your urine?" (e.g., none, pea, grape, small coin)     Clear- blood clot 2. ONSET: "When did the bleeding start?"      All night 3. EPISODES: "How many times has there been blood in the urine?" or "How many times today?"     once 4. PAIN with URINATION: "Is there any pain with passing your urine?" If Yes, ask: "How bad is the pain?"  (Scale 1-10; or mild, moderate, severe)    - MILD: Complains slightly about urination hurting.    - MODERATE: Interferes with normal activities.      - SEVERE: Excruciating, unwilling or unable to urinate because of the pain.      Cramping- pressure after urination 5. FEVER: "Do you have a fever?" If Yes, ask: "What is your temperature, how was it measured, and when did it start?"     no 6. ASSOCIATED SYMPTOMS: "Are you passing urine more frequently than usual?"     frequency 7. OTHER SYMPTOMS: "Do you have any other symptoms?" (e.g., back/flank pain, abdomen pain, vomiting)     Abdominal cramping, pressure, frequency  Protocols used: Urine - Blood In-A-AH

## 2022-10-17 NOTE — Telephone Encounter (Signed)
  Chief Complaint: blood clot in urine Symptoms: blood in urine, frequency, pain at end of stream, pressure Frequency: all night Pertinent Negatives: Patient denies back/flank pain, vomiting Disposition: []$ ED /[]$ Urgent Care (no appt availability in office) / [x]$ Appointment(In office/virtual)/ []$  [place]/ []$ Home Care/ []$ Refused Recommended Disposition /[]$ [place] [facility]/ []$  Follow-up with PCP Additional Notes: Patient has been scheduled for this afternoon- she will continue to monitor symptoms and call back with increase- otherwise will keep appointment

## 2022-10-18 ENCOUNTER — Telehealth: Payer: Self-pay | Admitting: Family Medicine

## 2022-10-18 DIAGNOSIS — N390 Urinary tract infection, site not specified: Secondary | ICD-10-CM

## 2022-10-18 LAB — PHENYTOIN LEVEL, TOTAL: Phenytoin (Dilantin), Serum: <0.8 ug/mL — ABNORMAL LOW (ref 10.0–20.0)

## 2022-10-18 LAB — VITAMIN D 25 HYDROXY (VIT D DEFICIENCY, FRACTURES): Vit D, 25-Hydroxy: 26.5 ng/mL — ABNORMAL LOW (ref 30.0–100.0)

## 2022-10-18 NOTE — Telephone Encounter (Signed)
Called to pharmacy 

## 2022-10-18 NOTE — Telephone Encounter (Signed)
Pharmacy request verbal order for medication, it has not been received by fax, please advise.

## 2022-10-18 NOTE — Telephone Encounter (Signed)
Pt stated pharmacy advised her they have not received Rx for medication cephALEXin (KEFLEX) 500 MG capsule. Stated they are having issues with the fax and are requesting a verbal order to be able to fill medication for pt as soon as possible.    Please advise.   [facility] - [place], [place] - [address]  [place] [address]  Phone: [phone] Fax: [phone]  Hours: Not open 24 hours

## 2022-10-20 LAB — URINE CULTURE

## 2022-10-20 LAB — SPECIMEN STATUS REPORT

## 2023-01-30 ENCOUNTER — Ambulatory Visit (INDEPENDENT_AMBULATORY_CARE_PROVIDER_SITE_OTHER): Payer: Medicaid Other | Admitting: Family Medicine

## 2023-01-30 ENCOUNTER — Encounter: Payer: Self-pay | Admitting: Family Medicine

## 2023-01-30 VITALS — BP 109/68 | HR 76 | Temp 98.2°F | Resp 13 | Ht 66.0 in | Wt 264.5 lb

## 2023-01-30 DIAGNOSIS — K5900 Constipation, unspecified: Secondary | ICD-10-CM

## 2023-01-30 DIAGNOSIS — G40909 Epilepsy, unspecified, not intractable, without status epilepticus: Secondary | ICD-10-CM

## 2023-01-30 MED ORDER — LACTULOSE 20 GM/30ML PO SOLN: 15.0000 mL | Freq: Two times a day (BID) | ORAL | 1 refills | Status: DC | PRN

## 2023-01-30 NOTE — Progress Notes (Signed)
   I,Vanessa  Vital,acting as a Neurosurgeon for [name], MD.,have documented all relevant documentation on the behalf of [name], MD,as directed by  [name], MD      Established patient visit   Patient: Wendy Ferguson   DOB: [birth year]   42 y.o. Female  MRN: [id number] Visit Date: 01/30/2023  Today's healthcare provider: [name], MD   No chief complaint on file.  Subjective    HPI  Patient states she has had trouble have bowel movement, pain when she tried and when she is able to it is very little but not all she has to go and takes a long time to try and have the bowel movement. Patient states she has this issue for about a week now and is very uncomfortable.  She has tried laxatives but have not helped any. She describes the pain when trying to have a bowel movement as sharp pains and has had headaches. No blood She is also needs depakote levels checked, which Dr. Malvin Johns prescribes for seizures.  . Medications: Outpatient Medications Prior to Visit  Medication Sig   acetaminophen (TYLENOL) 325 MG tablet Take 650 mg by mouth as needed.   Cholecalciferol (VITAMIN D) 50 MCG (2000 UT) tablet Take 2,000 Units by mouth daily.   divalproex (DEPAKOTE) 500 MG DR tablet Take 1 tablet by mouth in the morning, at noon, and at bedtime.   levocetirizine (XYZAL) 5 MG tablet Take 1 tablet (5 mg total) by mouth every evening.   Multiple Vitamin (MULTIVITAMIN PO) Take by mouth.   No facility-administered medications prior to visit.        Objective    BP 109/68 (BP Location: Right Arm, Patient Position: Sitting, Cuff Size: Large)   Pulse 76   Temp 98.2 F (36.8 C) (Oral)   Resp 13   Ht 5\' 6"  (1.676 m)   Wt 264 lb 8 oz (120 kg)   SpO2 99%   BMI 42.69 kg/m    Physical Exam   General Appearance:    Obese female, alert, cooperative, in no acute distress  Eyes:    PERRL, conjunctiva/corneas clear, EOM's intact       Lungs:     Clear to auscultation bilaterally,  respirations unlabored  Heart:    Normal heart rate. Normal rhythm. No murmurs, rubs, or gallops.    Abdomen:   bowel sounds present and normal in all 4 quadrants, soft, round, or nontender. No CVA tenderness       appropriate.   Assessment & Plan     1. Seizure disorder (HCC)  - Valproic Acid level  2. Constipation, unspecified constipation type  - Lactulose 20 GM/30ML SOLN; Take 15-30 mLs (10-20 g total) by mouth 2 (two) times  daily as needed (constipation).  Dispense: 240 mL; Refill: 1   Call if symptoms change or if not rapidly improving.       The entirety of the information documented in the History of Present Illness, Review of Systems and Physical Exam were personally obtained by me. Portions of this information were initially documented by the CMA and reviewed by me for thoroughness and accuracy.     [name], MD  [facility] [phone] (phone) [phone] (fax)  [facility]

## 2023-01-31 LAB — VALPROIC ACID LEVEL: Valproic Acid Lvl: 23 ug/mL — ABNORMAL LOW (ref 50–100)

## 2023-02-28 ENCOUNTER — Other Ambulatory Visit: Payer: Self-pay

## 2023-02-28 MED ORDER — DIVALPROEX SODIUM 500 MG PO DR TAB
500.0000 mg | DELAYED_RELEASE_TABLET | Freq: Three times a day (TID) | ORAL | 3 refills | Status: DC
Start: 2022-08-31 — End: 2024-03-25
  Filled 2023-03-23: qty 270, 90d supply, fill #0

## 2023-03-23 ENCOUNTER — Other Ambulatory Visit: Payer: Self-pay

## 2023-03-24 ENCOUNTER — Other Ambulatory Visit: Payer: Self-pay

## 2023-04-03 ENCOUNTER — Ambulatory Visit: Payer: Self-pay | Admitting: *Deleted

## 2023-04-03 ENCOUNTER — Encounter: Payer: Self-pay | Admitting: Family Medicine

## 2023-04-03 ENCOUNTER — Ambulatory Visit (INDEPENDENT_AMBULATORY_CARE_PROVIDER_SITE_OTHER): Payer: Medicaid Other | Admitting: Family Medicine

## 2023-04-03 ENCOUNTER — Other Ambulatory Visit: Payer: Self-pay

## 2023-04-03 VITALS — BP 133/78 | HR 100 | Ht 66.0 in | Wt 264.8 lb

## 2023-04-03 DIAGNOSIS — G40909 Epilepsy, unspecified, not intractable, without status epilepticus: Secondary | ICD-10-CM | POA: Diagnosis not present

## 2023-04-03 DIAGNOSIS — E559 Vitamin D deficiency, unspecified: Secondary | ICD-10-CM

## 2023-04-03 DIAGNOSIS — L600 Ingrowing nail: Secondary | ICD-10-CM | POA: Diagnosis not present

## 2023-04-03 MED ORDER — CEPHALEXIN 500 MG PO CAPS
500.0000 mg | ORAL_CAPSULE | Freq: Two times a day (BID) | ORAL | 0 refills | Status: AC
  Filled 2023-04-03: qty 20, 10d supply, fill #0

## 2023-04-03 NOTE — Telephone Encounter (Signed)
  Chief Complaint: Ingrown toenail Symptoms: Great toe, right foot. Red, pus like drainage, redness spreading, 10/10 pain. Frequency: 1 week Pertinent Negatives: Patient denies fever Disposition: [] ED /[] Urgent Care (no appt availability in office) / [x] Appointment(In office/virtual)/ []  [place] [facility]/ [] Home Care/ [] Refused Recommended Disposition /[] [place] [facility]/ []  Follow-up with PCP Additional Notes: Appt secured for today. CAre advise provided, pt verbalizes understanding.  Reason for Disposition  [1] Skin around the nail has become red AND [2] larger than 2 inches (5 cm)  Answer Assessment - Initial Assessment Questions 1. LOCATION: "Which toe?"      Great toe, right foot 2. APPEARANCE: "What does it look like?"      "Pus looking drainage." 3. ONSET: "When did it start?"      1 week 4. PAIN: "Is there any pain?" If Yes, ask: "How bad is the pain?"   (Scale 1-10; or mild, moderate, severe)    - NONE (0): none     - MILD (1-3): doesn't interfere with normal activities    - MODERATE (4-7): interferes with normal activities or awakens from sleep    - SEVERE (8-10): excruciating pain, unable to do any normal activities    10/10 5. REDNESS: "Is there any redness of the skin?" If Yes, ask: "How much of the toe is red?"     Yes, most of toe 6. OTHER SYMPTOMS: "Do you have any other symptoms?" (e.g., chills, fever, red streak up foot)     No  Protocols used: Toenail - Ingrown-A-AH

## 2023-04-03 NOTE — Progress Notes (Signed)
      Established patient visit   Patient: Wendy Ferguson   DOB: 1981/05/16   42 y.o. Female  MRN: [id number] Visit Date: 04/03/2023  Today's healthcare provider: [name], MD   Chief Complaint  Patient presents with   Toe Pain    Patient reports ingrown toenail on right foot. Associated with puss like drainage from between the nail and skin with redness X 1 week.   Subjective    HPI  Discussed the use of AI scribe software for clinical note transcription with the patient, who gave verbal consent to proceed.  History of Present Illness   The patient presents with a chief complaint of a sore right toe, which has been persisting for over a week. The patient reports that the condition of the toe was initially worse, and in an attempt to alleviate the discomfort, she removed some of the skin to relieve pressure. This intervention provided temporary relief, but the patient now reports a throbbing sensation in the toe. The patient also notes occasional drainage from the toe, which subsequently dries up.  In addition to the primary complaint, the patient expresses concern about recent rapid weight gain and requests lab tests to determine if she has developed diabetes. The patient also mentions a previous diagnosis of low vitamin D levels, which were last checked in January, and expresses concern that these levels may still be low. The patient also takes Depakote, and it is noted that the levels of this medication need to be checked. .       Medications: Outpatient Medications Prior to Visit  Medication Sig   acetaminophen (TYLENOL) 325 MG tablet Take 650 mg by mouth as needed.   Cholecalciferol (VITAMIN D) 50 MCG (2000 UT) tablet Take 2,000 Units by mouth daily.   divalproex (DEPAKOTE) 500 MG DR tablet Take 1 tablet by mouth in the morning, at noon, and at bedtime.   divalproex (DEPAKOTE) 500 MG DR tablet Take 1 tablet (500 mg total) by mouth 3 (three) times daily.   Lactulose 20  GM/30ML SOLN Take 15-30 mLs (10-20 g total) by mouth 2 (two) times daily as needed (constipation).   levocetirizine (XYZAL) 5 MG tablet Take 1 tablet (5 mg total) by mouth every evening.   Multiple Vitamin (MULTIVITAMIN PO) Take by mouth.   No facility-administered medications prior to visit.     Objective    BP 133/78 (BP Location: Left Arm, Patient Position: Sitting, Cuff Size: Normal)   Pulse 100   Ht 5\' 6"  (1.676 m)   Wt 264 lb 12.8 oz (120.1 kg)   BMI 42.74 kg/m    Physical Exam      Assessment & Plan     1. Ingrown toenail of right foot with infection Counseled to soak in epsom salts.  - cephALEXin (KEFLEX) 500 MG capsule; Take 1 capsule (500 mg total) by mouth 2 (two) times daily for 10 days.  Dispense: 20 capsule; Refill: 0  Call if symptoms change or if not rapidly improving.    2. Vitamin D deficiency  - VITAMIN D 25 Hydroxy (Vit-D Deficiency, Fractures)  3. Seizure disorder (HCC)  - Valproic Acid level  4. Morbid obesity (HCC)  - TSH - Comprehensive metabolic panel       [name], MD  [facility] [phone] (phone) [phone] (fax)  [facility]

## 2023-04-04 ENCOUNTER — Ambulatory Visit: Payer: Self-pay

## 2023-04-04 ENCOUNTER — Other Ambulatory Visit: Payer: Self-pay

## 2023-04-04 NOTE — Telephone Encounter (Signed)
Message from [place] C sent at 04/04/2023 10:42 AM EDT  Summary: rx req / toe concern   The patient shares that they are experiencing significant discomfort in their right big toe  The patient has been seen for their infected toe and prescribed abx  Please contact the patient further when possible        Called pt and LM on VM to call back

## 2023-04-04 NOTE — Telephone Encounter (Signed)
  Chief Complaint: Pain Symptoms: NA Frequency: NA Pertinent Negatives: Patient denies NA Disposition: [] ED /[] Urgent Care (no appt availability in office) / [] Appointment(In office/virtual)/ []  [place] [facility]/ [] Home Care/ [] Refused Recommended Disposition /[] [place] [facility]/  Follow-up with PCP Additional Notes:  Pt seen yesterday, ingrown toe nail/infection. Started ATBs, requesting  something for pain. Stated "Up all night with pain, tylenol didn't help." States "IBU he has prescribed for me in that higher dose works."  Please advise. Reason for Disposition  [1] Caller has URGENT medicine question about med that PCP or specialist prescribed AND [2] triager unable to answer question  Answer Assessment - Initial Assessment Questions 1. NAME of MEDICINE: "What medicine(s) are you calling about?"     Pain med 2. QUESTION: "What is your question?" (e.g., double dose of medicine, side effect)     CAn pain med be prescribed 3. PRESCRIBER: "Who prescribed the medicine?" Reason: if prescribed by specialist, call should be referred to that group.     NA  Protocols used: Medication Question Call-A-AH

## 2023-04-05 ENCOUNTER — Encounter: Payer: Self-pay | Admitting: *Deleted

## 2023-04-05 ENCOUNTER — Telehealth: Payer: Self-pay

## 2023-04-05 ENCOUNTER — Other Ambulatory Visit: Payer: Self-pay

## 2023-04-05 ENCOUNTER — Other Ambulatory Visit: Payer: Self-pay | Admitting: Family Medicine

## 2023-04-05 DIAGNOSIS — L600 Ingrowing nail: Secondary | ICD-10-CM

## 2023-04-05 MED ORDER — IBUPROFEN 600 MG PO TABS: 600.0000 mg | ORAL_TABLET | Freq: Three times a day (TID) | ORAL | 0 refills | Status: DC | PRN

## 2023-04-05 NOTE — Telephone Encounter (Signed)
Patient was seen yesterday by Dr Sherrie Mustache and he is out today Thanks   Copied from CRM [id number]. Topic: General - Other >> Apr 05, 2023  8:04 AM Franchot Heidelberg wrote: Reason for CRM: Pt called following up on request from yesterday regarding a higher strength prescription for pain medicine. She says her toe is in pain and has caused her not to sleep.

## 2023-04-05 NOTE — Telephone Encounter (Signed)
Reviewed message from Verna Czech, FNP from 04/05/23 and S. Chales Abrahams , CMA to seek emergent care for pain if not improved on 04/05/23. Patient reports she is requesting PCP to prescribe ibuprofen 600-800 mg for pain due to her hx of seizures. Please advise.

## 2023-04-05 NOTE — Telephone Encounter (Signed)
This encounter was created in error - please disregard.

## 2023-04-05 NOTE — Telephone Encounter (Signed)
LVM advising to seek emergent care and to call back if any further questions or concerns. CRM created. Ok for [facility] to advise

## 2023-04-12 ENCOUNTER — Other Ambulatory Visit: Payer: Self-pay

## 2023-04-13 ENCOUNTER — Other Ambulatory Visit: Payer: Self-pay

## 2023-04-13 ENCOUNTER — Ambulatory Visit: Payer: Medicaid Other | Admitting: Podiatry

## 2023-04-13 DIAGNOSIS — L600 Ingrowing nail: Secondary | ICD-10-CM | POA: Diagnosis not present

## 2023-04-13 DIAGNOSIS — Z01818 Encounter for other preprocedural examination: Secondary | ICD-10-CM | POA: Diagnosis not present

## 2023-04-13 MED ORDER — DOXYCYCLINE HYCLATE 100 MG PO TABS
100.0000 mg | ORAL_TABLET | Freq: Two times a day (BID) | ORAL | 0 refills | Status: DC
  Filled 2023-04-13: qty 20, 10d supply, fill #0

## 2023-04-13 NOTE — Progress Notes (Signed)
Subjective:  Patient ID: Wendy Ferguson, female    DOB: Apr 09, 1981,  MRN: [id number]  Chief Complaint  Patient presents with   Nail Problem    Rt hallux discomfort    42 y.o. female presents with the above complaint.  Patient presents with right hallux lateral border ingrown painful to touch is progressive gotten worse worse with ambulation worse with pressure she would like to have it removed but she is not able to undergo local anesthesia.  She would like to do it in the operating room denies any other acute complaints.   Review of Systems: Negative except as noted in the HPI. Denies N/V/F/Ch.  Past Medical History:  Diagnosis Date   Complication of anesthesia 09/2012   pt had a seizure during partial hysterectomy surgery    Helicobacter pylori gastritis    Ovarian cyst    Seizures (HCC)     Current Outpatient Medications:    acetaminophen (TYLENOL) 325 MG tablet, Take 650 mg by mouth as needed., Disp: , Rfl:    cephALEXin (KEFLEX) 500 MG capsule, Take 1 capsule (500 mg total) by mouth 2 (two) times daily for 10 days., Disp: 20 capsule, Rfl: 0   Cholecalciferol (VITAMIN D) 50 MCG (2000 UT) tablet, Take 2,000 Units by mouth daily., Disp: , Rfl:    divalproex (DEPAKOTE) 500 MG DR tablet, Take 1 tablet by mouth in the morning, at noon, and at bedtime., Disp: , Rfl:    divalproex (DEPAKOTE) 500 MG DR tablet, Take 1 tablet (500 mg total) by mouth 3 (three) times daily., Disp: 270 tablet, Rfl: 3   ibuprofen (ADVIL) 600 MG tablet, Take 1 tablet (600 mg total) by mouth every 8 (eight) hours as needed for mild pain., Disp: 10 tablet, Rfl: 0   Lactulose 20 GM/30ML SOLN, Take 15-30 mLs (10-20 g total) by mouth 2 (two) times daily as needed (constipation)., Disp: 240 mL, Rfl: 1   levocetirizine (XYZAL) 5 MG tablet, Take 1 tablet (5 mg total) by mouth every evening., Disp: 90 tablet, Rfl: 0   Multiple Vitamin (MULTIVITAMIN PO), Take by mouth., Disp: , Rfl:   Social History   Tobacco Use   Smoking Status Former   Current packs/day: 0.00   Average packs/day: 1 pack/day for 13.0 years (13.0 ttl pk-yrs)   Types: Cigarettes   Start date: 09/01/1994   Quit date: 09/02/2007   Years since quitting: 15.6  Smokeless Tobacco Never    Allergies  Allergen Reactions   Bee Venom     Other reaction(s): Other (See Comments) Chest pain, sweating, fever   Citalopram Diarrhea and Nausea And Vomiting   Tylenol  [Acetaminophen]    Adhesive [Tape] Rash    With certain bandaids cause a rash   Erythromycin Rash   Objective:  There were no vitals filed for this visit. There is no height or weight on file to calculate BMI. Constitutional Well developed. Well nourished.  Vascular Dorsalis pedis pulses palpable bilaterally. Posterior tibial pulses palpable bilaterally. Capillary refill normal to all digits.  No cyanosis or clubbing noted. Pedal hair growth normal.  Neurologic Normal speech. Oriented to person, place, and time. Epicritic sensation to light touch grossly present bilaterally.  Dermatologic Painful ingrowing nail at lateral nail borders of the hallux nail right. No other open wounds. No skin lesions.  Orthopedic: Normal joint ROM without pain or crepitus bilaterally. No visible deformities. No bony tenderness.   Radiographs: None Assessment:  No diagnosis found. Plan:  Patient was evaluated and treated  and all questions answered.  Ingrown Nail, right -I discussed with the patient she will benefit from a removal of the ingrown right toe however given that she has fear of needles she would benefit from removal in the operating room.  I discussed my preoperative intraoperative postop plan with the patient extensive detail she states understanding like to proceed with surgery in the operating room. -Informed surgical risk consent was reviewed and read aloud to the patient.  I reviewed the films.  I have discussed my findings with the patient in great detail.  I have  discussed all risks including but not limited to infection, stiffness, scarring, limp, disability, deformity, damage to blood vessels and nerves, numbness, poor healing, need for braces, arthritis, chronic pain, amputation, death.  All benefits and realistic expectations discussed in great detail.  I have made no promises as to the outcome.  I have provided realistic expectations.  I have offered the patient a 2nd opinion, which they have declined and assured me they preferred to proceed despite the risks  No follow-ups on file.

## 2023-04-25 ENCOUNTER — Telehealth: Payer: Self-pay | Admitting: Podiatry

## 2023-04-25 NOTE — Telephone Encounter (Signed)
Called patient to schedule surgery, she states that her toe no longer hurts and does not want to proceed with surgery. She asked if she can call to reschedule if her to e starts to hurt again and I advised that the consents will typically last 6 months before she needs to come be reevaluated for surgery.

## 2023-07-14 ENCOUNTER — Emergency Department
Admission: EM | Admit: 2023-07-14 | Discharge: 2023-07-14 | Disposition: A | Discharge status: Home / Self Care | Payer: Medicaid Other | Attending: Emergency Medicine | Admitting: Emergency Medicine

## 2023-07-14 ENCOUNTER — Emergency Department: Payer: Medicaid Other

## 2023-07-14 ENCOUNTER — Other Ambulatory Visit: Payer: Self-pay

## 2023-07-14 DIAGNOSIS — W172XXA Fall into hole, initial encounter: Secondary | ICD-10-CM | POA: Diagnosis not present

## 2023-07-14 DIAGNOSIS — S93402A Sprain of unspecified ligament of left ankle, initial encounter: Secondary | ICD-10-CM | POA: Insufficient documentation

## 2023-07-14 DIAGNOSIS — I10 Essential (primary) hypertension: Secondary | ICD-10-CM | POA: Diagnosis not present

## 2023-07-14 DIAGNOSIS — R000 Tachycardia, unspecified: Secondary | ICD-10-CM | POA: Diagnosis not present

## 2023-07-14 DIAGNOSIS — M25572 Pain in left ankle and joints of left foot: Secondary | ICD-10-CM | POA: Diagnosis present

## 2023-07-14 MED ORDER — IBUPROFEN 800 MG PO TABS
800.0000 mg | ORAL_TABLET | Freq: Once | ORAL | Status: AC
  Administered 2023-07-14: 800 mg via ORAL
  Filled 2023-07-14: qty 1

## 2023-07-14 NOTE — ED Provider Notes (Signed)
[facility] Provider Note    Event Date/Time   First MD Initiated Contact with Patient 07/14/23 1202     (approximate)   History   No chief complaint on file.   HPI  Wendy Ferguson is a 42 y.o. female with PMH of ovarian cyst and seizures presents for evaluation of left ankle pain.  Patient states she stepped in a hole last night when taking her dog out and twisted her ankle.  She has had pain, swelling and difficulty walking since.      Physical Exam   Triage Vital Signs: ED Triage Vitals  Encounter Vitals Group     BP 07/14/23 1156 (!) 144/86     Systolic BP Percentile --      Diastolic BP Percentile --      Pulse Rate 07/14/23 1156 (!) 101     Resp 07/14/23 1156 18     Temp 07/14/23 1156 97.9 F (36.6 C)     Temp src --      SpO2 07/14/23 1156 97 %     Weight 07/14/23 1154 280 lb (127 kg)     Height 07/14/23 1154 5\' 6"  (1.676 m)     Head Circumference --      Peak Flow --      Pain Score 07/14/23 1154 10     Pain Loc --      Pain Education --      Exclude from Growth Chart --     Most recent vital signs: Vitals:   07/14/23 1156  BP: (!) 144/86  Pulse: (!) 101  Resp: 18  Temp: 97.9 F (36.6 C)  SpO2: 97%   General: Awake, mild distress due to pain. CV:  Good peripheral perfusion.  Resp:  Normal effort.  Abd:  No distention.  Other:  Left ankle is swollen when compared to the right, no overlying skin changes or bruising, tender to palpation at the medial and lateral malleolus as well as across the joint line, patient unable to perform ROM due to pain, dorsalis pedis pulse 2+ regular, sensation intact, capillary refill appropriate.   ED Results / Procedures / Treatments   Labs (all labs ordered are listed, but only abnormal results are displayed) Labs Reviewed - No data to display   RADIOLOGY  Left ankle x-ray obtained, interpreted the images as well as reviewed the radiologist report.   PROCEDURES:  Critical Care  performed: No  Procedures   MEDICATIONS ORDERED IN ED: Medications  ibuprofen (ADVIL) tablet 800 mg (800 mg Oral Given 07/14/23 1303)     IMPRESSION / MDM / ASSESSMENT AND PLAN / ED COURSE  I reviewed the triage vital signs and the nursing notes.                             42 year old female presents for evaluation of left ankle injury. Pt was hypertensive and tachycardic in triage, otherwise VSS. Patient in mild distress on exam due to pain.   Differential diagnosis includes, but is not limited to, ankle fracture, ankle sprain, ankle dislocation, foot fracture, foot sprain.  Patient's presentation is most consistent with acute complicated illness / injury requiring diagnostic workup.  Left ankle xray negative for fracture.  Given the negative xrays and degree of swelling, I believe patient has an ankle sprain. She was given a walking boot as well as crutches.  She can take Tylenol and ibuprofen as  needed for pain at home.  She was advised to ice and elevate her foot.  She can follow-up with podiatry as needed.  She voiced understanding, all questions were answered and she was stable at discharge.      FINAL CLINICAL IMPRESSION(S) / ED DIAGNOSES   Final diagnoses:  Sprain of left ankle, unspecified ligament, initial encounter     Rx / DC Orders   ED Discharge Orders     None        Note:  This document was prepared using Dragon voice recognition software and may include unintentional dictation errors.   Cameron Ali, PA-C 07/14/23 1343    Pilar Jarvis, MD 07/14/23 [id number]

## 2023-07-14 NOTE — Discharge Instructions (Addendum)
You can take 650 mg of Tylenol and 600 mg of ibuprofen every 6 hours as needed for pain.  Ice and elevate your foot as much as you can. This will help decrease your swelling and will improve your pain.   Follow up with podiatry if you are still having pain in a week or if you are not improving.

## 2023-07-14 NOTE — ED Triage Notes (Signed)
Pt to ED For fall last night, tripped in hole. States pain to left ankle. Denies hitting head or LOC

## 2023-11-09 ENCOUNTER — Other Ambulatory Visit: Payer: Self-pay

## 2023-11-10 ENCOUNTER — Other Ambulatory Visit: Payer: Self-pay

## 2023-11-14 ENCOUNTER — Other Ambulatory Visit: Payer: Self-pay

## 2023-11-20 ENCOUNTER — Other Ambulatory Visit: Payer: Self-pay

## 2023-11-20 MED ORDER — PREGABALIN 25 MG PO CAPS
25.0000 mg | ORAL_CAPSULE | Freq: Every day | ORAL | 1 refills | Status: DC
  Filled 2023-11-20: qty 30, 18d supply, fill #0
  Filled 2023-12-05: qty 30, 15d supply, fill #1

## 2023-12-05 ENCOUNTER — Other Ambulatory Visit: Payer: Self-pay

## 2023-12-05 MED ORDER — PREGABALIN 25 MG PO CAPS
ORAL_CAPSULE | ORAL | 0 refills | Status: DC
  Filled 2023-12-14: qty 30, 9d supply, fill #0

## 2023-12-14 ENCOUNTER — Other Ambulatory Visit: Payer: Self-pay

## 2023-12-18 ENCOUNTER — Other Ambulatory Visit: Payer: Self-pay

## 2023-12-18 MED ORDER — PREGABALIN 100 MG PO CAPS
100.0000 mg | ORAL_CAPSULE | Freq: Every day | ORAL | 2 refills | Status: AC
  Filled 2023-12-18: qty 30, 30d supply, fill #0
  Filled 2024-01-16 – 2024-01-17 (×2): qty 30, 30d supply, fill #1

## 2024-01-01 ENCOUNTER — Ambulatory Visit: Payer: Self-pay

## 2024-01-01 NOTE — Telephone Encounter (Signed)
 Dr. Athena Bland has openings tomorrow or can one of my sameday slots Next week.

## 2024-01-01 NOTE — Telephone Encounter (Signed)
 Chief Complaint: ankle pain Symptoms: L ankle pain Frequency: sprained the ankle last November Pertinent Negatives: Patient denies redness, discoloration, CP Disposition: [] ED /[x] Urgent Care (no appt availability in office) / [] Appointment(In office/virtual)/ []  [facility]/ [] Home Care/ [x] Refused Recommended Disposition /[] [facility]/ []  Follow-up with PCP Additional Notes: Pt reports L ankle pain since she sprained the ankle in November of last year. Pt states the ankle pain has been worsening since January. Pt endorses 9/10 pain that worse with walking. Pt states when she walks or bears weight on the foot it feels like the ankle is re-spraining. Pt endorses bilateral ankle swelling d/t tendonitis. Denies redness, discoloration, calf pain or swelling. Pt endorses neuropathy at night. Denies CP but endorses SOB only on exertion  "for forever" d/t weight gain. Pt reports Tylenol  and ibuprofen  don't help the pain. RN advised pt she should be seen within 4 hours and advised UC. No availability in the office til June. Pt declined stating her only transport is a scooter and it's raining. RN advised the pt when/if the weather clears and it's safe, she should go to UC. RN also advised the pt the RN would relay to the office pt's symptoms and ask the office to follow-up with the pt in regards to scheduling an appt before June if possible.    Copied from CRM [id number]. Topic: Clinical - Red Word Triage >> Jan 01, 2024  1:11 PM Fredrica W wrote: Red Word that prompted transfer to Nurse Triage: Sprain foot and ankle - was seen for it in ED last year. Hurt every time she steps feels like she is re spraining it. Worsening pain Reason for Disposition  [1] SEVERE pain (e.g., excruciating, unable to walk) AND [2] not improved after 2 hours of pain medicine  Answer Assessment - Initial Assessment Questions 1. ONSET: "When did the pain start?"      Ankle sprain in November last year, pain  worsening since January. Pt states her ankle was itching as well  2. LOCATION: "Where is the pain located?"      Ankle bone - L ankle  3. PAIN: "How bad is the pain?"    (Scale 1-10; or mild, moderate, severe)  - MILD (1-3): doesn't interfere with normal activities.   - MODERATE (4-7): interferes with normal activities (e.g., work or school) or awakens from sleep, limping.   - SEVERE (8-10): excruciating pain, unable to do any normal activities, unable to walk.      9/10  4. WORK OR EXERCISE: "Has there been any recent work or exercise that involved this part of the body?"      Sprain last year 5. CAUSE: "What do you think is causing the ankle pain?"     Sprain last November  6. OTHER SYMPTOMS: "Do you have any other symptoms?" (e.g., calf pain, rash, fever, swelling)     "Every time I walk I feel like I am re-spraining it", pt endorses she can bear weight and walk on it even though it is painful. Endorses tendonitis in both feet. "Ankles stay swollen" d/t tendonitis. Hx of neuropathy only at night. Endorses ankle swelling, states it is not new. Endorses bilateral ankle swelling. Denies redness. "If you touch it right on the ankle and push down on it I can feel the pain." Denies calf pain or swelling. Denies bruising or discoloration. Denies fevers. "I try as much as I can to keep my feet soaked." Denies CP. States she has SOB "but it's  about the same." Endorses SOB on exertion, never at rest. "It's been forever."  Protocols used: Ankle Pain-A-AH

## 2024-01-01 NOTE — Telephone Encounter (Signed)
 Appt scheduled for 01/02/24 at 2:20 w/ Dr. Athena Bland

## 2024-01-02 ENCOUNTER — Other Ambulatory Visit: Payer: Self-pay

## 2024-01-02 ENCOUNTER — Ambulatory Visit: Admitting: Family Medicine

## 2024-01-02 ENCOUNTER — Encounter: Payer: Self-pay | Admitting: Family Medicine

## 2024-01-02 VITALS — BP 130/86 | HR 93 | Temp 98.9°F | Ht 66.0 in | Wt 269.7 lb

## 2024-01-02 DIAGNOSIS — M25572 Pain in left ankle and joints of left foot: Secondary | ICD-10-CM | POA: Diagnosis not present

## 2024-01-02 DIAGNOSIS — G629 Polyneuropathy, unspecified: Secondary | ICD-10-CM

## 2024-01-02 DIAGNOSIS — S93412S Sprain of calcaneofibular ligament of left ankle, sequela: Secondary | ICD-10-CM

## 2024-01-02 DIAGNOSIS — M722 Plantar fascial fibromatosis: Secondary | ICD-10-CM

## 2024-01-02 DIAGNOSIS — G8929 Other chronic pain: Secondary | ICD-10-CM | POA: Insufficient documentation

## 2024-01-02 MED ORDER — TRAMADOL HCL 50 MG PO TABS
50.0000 mg | ORAL_TABLET | Freq: Three times a day (TID) | ORAL | 0 refills | Status: AC | PRN
  Filled 2024-01-02: qty 15, 5d supply, fill #0

## 2024-01-02 MED ORDER — AIRCAST SPORT ANKLE BRACE/LEFT MISC: 1.0000 | Freq: Every day | 0 refills | Status: AC

## 2024-01-02 NOTE — Progress Notes (Signed)
 Established patient visit   Patient: Wendy Ferguson   DOB: 12-Jun-1981   43 y.o. Female  MRN: [id number] Visit Date: 01/02/2024  Today's healthcare provider: Carlean Charter, DO   Chief Complaint  Patient presents with   Ankle Pain    Left ankle pain X November 2024 and worsening since January. Pt endorses 9/10 pain that worse with walking. Pt states when she walks or bears weight on the foot it feels like the ankle is re-spraining. Pt endorses bilateral ankle swelling d/t tendonitis. Denies redness, discoloration, calf pain or swelling. Pt endorses neuropathy at night. Pt reports Tylenol  and ibuprofen  don't help the pain. Pt reports when she flexes her foot it feels as if the bone is moving.    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  She experiences persistent pain in her left ankle following a sprain in November. The pain is severe, especially when pushing on the ankle or flexing her foot. Walking exacerbates the pain, making it feel as though she is 'spraining it over and over.' She also experiences numbness and tingling, which she attributes to her pre-existing neuropathy.  Initial treatment with Tylenol  and ibuprofen  was ineffective. She uses a cane for mobility and elevates her ankle on a pillow while sleeping to alleviate pain. Swelling and a sensation of fluid in the ankle are present. She recalls being told she had an 'extra bone' in her ankle that complicated her condition.  She has difficulty managing daily activities, such as taking her three dogs outside, due to the pain. Her current medications include Depakote  500 mg three times a day and Lyrica  100 mg. She has discontinued doxycycline  and does not take a multivitamin or vitamin D . She occasionally takes ibuprofen , up to 600 mg, but prefers to avoid it due to concerns about kidney health.  She has  a history of knee pain with four torn cartilages and has previously received a cortisone shot, which she found painful. She also mentions plantar fasciitis in her right heel and arch, for which she finds supportive shoes helpful.      Medications: Outpatient Medications Prior to Visit  Medication Sig   divalproex  (DEPAKOTE ) 500 MG DR tablet Take 1 tablet (500 mg total) by mouth 3 (three) times daily.   levocetirizine (XYZAL ) 5 MG tablet Take 1 tablet (5 mg total) by mouth every evening.   pregabalin  (LYRICA ) 100 MG capsule Take 1 capsule (100 mg total) by mouth at bedtime   [DISCONTINUED] acetaminophen  (TYLENOL ) 325 MG tablet Take 650 mg by mouth as needed.   [DISCONTINUED] Cholecalciferol (VITAMIN D ) 50 MCG (2000 UT) tablet Take 2,000 Units by mouth daily.   [DISCONTINUED] divalproex  (DEPAKOTE ) 500 MG DR tablet Take 1 tablet by mouth in the morning, at noon, and at bedtime.   [DISCONTINUED] doxycycline  (VIBRA -TABS) 100 MG tablet Take 1 tablet (100 mg total) by mouth 2 (two) times daily.   [DISCONTINUED] ibuprofen  (ADVIL ) 600 MG tablet Take 1 tablet (600 mg total) by mouth every 8 (eight) hours as needed for mild pain.   [DISCONTINUED] Lactulose  20 GM/30ML SOLN Take 15-30 mLs (10-20 g total) by mouth 2 (two) times daily as needed (constipation).   [DISCONTINUED] Multiple Vitamin (MULTIVITAMIN PO) Take by mouth.   [DISCONTINUED] pregabalin  (LYRICA ) 25 MG capsule Take 1 capsule (25 mg total) by mouth daily for 1  week. Then increase to 1 capusle by mouth 2 times a day and continue that dose.   [DISCONTINUED] pregabalin  (LYRICA ) 25 MG capsule Take 3 capsules (75 mg total) by mouth daily for 7 days, THEN 4 capsules (100 mg total) daily and continue at that dose.   No facility-administered medications prior to visit.        Objective    BP 130/86 (BP Location: Left Arm, Patient Position: Sitting, Cuff Size: Normal)   Pulse 93   Temp 98.9 F (37.2 C) (Oral)   Ht 5\' 6"  (1.676 m)   Wt 269 lb  11.2 oz (122.3 kg)   SpO2 100%   BMI 43.53 kg/m     Physical Exam Vitals and nursing note reviewed.  Constitutional:      General: She is not in acute distress.    Appearance: Normal appearance.  HENT:     Head: Normocephalic and atraumatic.  Eyes:     General: No scleral icterus.    Conjunctiva/sclera: Conjunctivae normal.  Cardiovascular:     Rate and Rhythm: Normal rate.  Pulmonary:     Effort: Pulmonary effort is normal.  Musculoskeletal:       Feet:  Feet:     Comments: Point tenderness of left ankle pain noted Neurological:     Mental Status: She is alert and oriented to person, place, and time. Mental status is at baseline.  Psychiatric:        Mood and Affect: Mood normal.        Behavior: Behavior normal.      No results found for any visits on 01/02/24.  Assessment & Plan    Chronic pain of left ankle -     Ambulatory referral to Orthopedic Surgery -     Aircast Sport Ankle Brace/Left; 1 each by Does not apply route daily.  Dispense: 1 each; Refill: 0 -     traMADol  HCl; Take 1 tablet (50 mg total) by mouth every 8 (eight) hours as needed for up to 5 days.  Dispense: 15 tablet; Refill: 0  Sprain of calcaneofibular ligament of left ankle, sequela -     Ambulatory referral to Orthopedic Surgery -     Aircast Sport Ankle Brace/Left; 1 each by Does not apply route daily.  Dispense: 1 each; Refill: 0  Plantar fasciitis of right foot  Peripheral polyneuropathy      Chronic pain of left ankle; sprain of calcaneofibular ligament of left ankle Chronic left ankle pain with severe pain during ambulation and foot flexion, possibly related to an old distal fibula fracture and a small separated bone fragment. Pain impairs daily activities. - Refer to orthopedics for further evaluation and management. - Prescribe short-term analgesics for pain. - Advise use of an ankle brace for support, especially when using a scooter, with caution against overuse. - Encourage  wearing a boot to offload the ankle.  Strongly emphasized the importance of this.  Plantar fasciitis Suspected plantar fasciitis in the right heel and arch with symptom relief from supportive footwear. - Educated on management, including using a frozen water bottle for foot rolling and specific exercises.  Neuropathy in legs Chronic neuropathy with persistent tingling and numbness, unaffected by recent ankle injury.  No acute concerns.  Continue to monitor.     Return in about 22 days (around 01/24/2024), or if symptoms worsen or fail to improve, for Chronic f/u w/PCP.      I discussed the assessment and treatment plan with the patient  The patient was provided an opportunity to ask questions and all were answered. The patient agreed with the plan and demonstrated an understanding of the instructions.   The patient was advised to call back or seek an in-person evaluation if the symptoms worsen or if the condition fails to improve as anticipated.    Carlean Charter, DO  [facility] [phone] (phone) [phone] (fax)  [facility]

## 2024-01-12 ENCOUNTER — Other Ambulatory Visit: Payer: Self-pay

## 2024-01-12 MED ORDER — MELOXICAM 7.5 MG PO TABS
7.5000 mg | ORAL_TABLET | Freq: Two times a day (BID) | ORAL | 1 refills | Status: DC
  Filled 2024-01-12: qty 60, 30d supply, fill #0

## 2024-01-16 ENCOUNTER — Other Ambulatory Visit: Payer: Self-pay

## 2024-01-17 ENCOUNTER — Other Ambulatory Visit: Payer: Self-pay

## 2024-01-23 ENCOUNTER — Other Ambulatory Visit: Payer: Self-pay

## 2024-01-23 MED ORDER — PREGABALIN 100 MG PO CAPS
100.0000 mg | ORAL_CAPSULE | Freq: Two times a day (BID) | ORAL | 2 refills | Status: AC
  Filled 2024-02-05: qty 60, 30d supply, fill #0
  Filled 2024-03-19: qty 60, 30d supply, fill #1

## 2024-01-26 ENCOUNTER — Other Ambulatory Visit (HOSPITAL_COMMUNITY): Payer: Self-pay

## 2024-01-26 ENCOUNTER — Encounter: Payer: Self-pay | Admitting: Family Medicine

## 2024-01-26 ENCOUNTER — Telehealth: Payer: Self-pay

## 2024-01-26 ENCOUNTER — Ambulatory Visit: Admitting: Family Medicine

## 2024-01-26 ENCOUNTER — Other Ambulatory Visit: Payer: Self-pay

## 2024-01-26 VITALS — BP 115/84 | HR 83 | Ht 66.0 in | Wt 269.7 lb

## 2024-01-26 DIAGNOSIS — M25572 Pain in left ankle and joints of left foot: Secondary | ICD-10-CM

## 2024-01-26 DIAGNOSIS — M722 Plantar fascial fibromatosis: Secondary | ICD-10-CM | POA: Diagnosis not present

## 2024-01-26 DIAGNOSIS — R569 Unspecified convulsions: Secondary | ICD-10-CM

## 2024-01-26 DIAGNOSIS — G8929 Other chronic pain: Secondary | ICD-10-CM

## 2024-01-26 MED ORDER — WEGOVY 0.25 MG/0.5ML ~~LOC~~ SOAJ
0.2500 mg | SUBCUTANEOUS | 0 refills | Status: DC
  Filled 2024-01-26 – 2024-01-30 (×4): qty 2, 28d supply, fill #0

## 2024-01-26 NOTE — Patient Instructions (Signed)
 Marland Kitchen  Please review the attached list of medications and notify my office if there are any errors.   . Please bring all of your medications to every appointment so we can make sure that our medication list is the same as yours.

## 2024-01-26 NOTE — Progress Notes (Signed)
 Established patient visit   Patient: Wendy Ferguson   DOB: 1980-10-04   43 y.o. Female  MRN: [id number] Visit Date: 01/26/2024  Today's healthcare provider: Jeralene Mom, MD   Chief Complaint  Patient presents with   Medical Management of Chronic Issues    Follow-up HTN, seizure disorder,insomnia   Subjective    Discussed the use of AI scribe software for clinical note transcription with the patient, who gave verbal consent to proceed.  History of Present Illness   Wendy Ferguson is a 43 year old female who presents with ankle pain and plantar fasciitis.  She has ongoing issues with her ankle, initially injured by walking into a hole, resulting in a sprain. She later fell to her knees, exacerbating the injury, and was informed at the hospital of an old fracture that has not healed. She has sprained the ankle twice, and the pain persists.  She experiences significant pain in her heel and arch of the foot, which she suspects is plantar fasciitis. The pain is severe and affects her daily activities.  She is currently taking Lyrica , 100 mg, for her feet falling asleep at night. She is concerned about potential side effects, including seizures, but notes that her seizures have practically stopped. She also takes Depakote  as needed to manage her seizure condition.  She weighs 269 pounds and wants to lose weight, aiming to reach at least 150 pounds. She is interested in weight loss options but prefers not to use injections due to a dislike of needles.  She reports feeling 'spaced out' after taking Lyrica , which affects her ability to function normally, as evidenced by her experience at [facility] where she felt disoriented.     Wt Readings from Last 3 Encounters:  01/26/24 269 lb 11.2 oz (122.3 kg)  01/02/24 269 lb 11.2 oz (122.3 kg)  07/14/23 280 lb (127 kg)   BMI Readings from Last 3 Encounters:  01/26/24 43.53 kg/m  01/02/24 43.53 kg/m  07/14/23 45.19 kg/m      Medications: Outpatient Medications Prior to Visit  Medication Sig   divalproex  (DEPAKOTE ) 500 MG DR tablet Take 1 tablet (500 mg total) by mouth 3 (three) times daily.   Elastic Bandages & Supports (AIRCAST SPORT ANKLE BRACE/LEFT) MISC 1 each by Does not apply route daily.   meloxicam  (MOBIC ) 7.5 MG tablet Take 1 tablet (7.5 mg total) by mouth 2 (two) times daily with a meal.   pregabalin  (LYRICA ) 100 MG capsule Take 1 capsule (100 mg total) by mouth at bedtime   pregabalin  (LYRICA ) 100 MG capsule Take 1 capsule (100 mg total) by mouth 2 (two) times daily   levocetirizine (XYZAL ) 5 MG tablet Take 1 tablet (5 mg total) by mouth every evening.   No facility-administered medications prior to visit.   Review of Systems  Constitutional:  Negative for appetite change, chills, fatigue and fever.  Respiratory:  Negative for chest tightness and shortness of breath.   Cardiovascular:  Negative for chest pain and palpitations.  Gastrointestinal:  Negative for abdominal pain, nausea and vomiting.  Neurological:  Negative for dizziness and weakness.       Objective    BP 115/84 (BP Location: Left Arm, Patient Position: Sitting, Cuff Size: Large)   Pulse 83   Ht 5\' 6"  (1.676 m)   Wt 269 lb 11.2 oz (122.3 kg)   SpO2 98%   BMI 43.53 kg/m   Physical Exam   General appearance: Severely obese female, cooperative and  in no acute distress Head: Normocephalic, without obvious abnormality, atraumatic Respiratory: Respirations even and unlabored, normal respiratory rate Extremities: All extremities are intact.  Skin: Skin color, texture, turgor normal. No rashes seen  Psych: Appropriate mood and affect. Neurologic: Mental status: Alert, oriented to person, place, and time, thought content appropriate.     Assessment & Plan        Ankle Sprain with Old Fracture Ankle sprain with unhealed old fracture causing pain and altered gait. - Advise rest to alleviate symptoms and improve  healing.  Plantar Fasciitis Heel and arch pain suggestive of plantar fasciitis. Referred to podiatry. - Continue with podiatry referral.  Seizure Disorder Seizures well-controlled with Depakote . Lyrica  does not cause seizures but may lead to withdrawal seizures if stopped abruptly. - Continue Depakote  as prescribed. - Monitor for seizure activity.  Weight Management Interested in weight loss. Willing to try Wegovy  despite aversion to injections. Explained administration and potential side effects. - Prescribe Wegovy  with instructions for use. - Schedule follow-up appointment in mid-August to assess progress.    Return in about 10 weeks (around 04/05/2024).     Jeralene Mom, MD  [facility] [phone] (phone) [phone] (fax)  [facility]

## 2024-01-30 ENCOUNTER — Other Ambulatory Visit: Payer: Self-pay

## 2024-01-31 ENCOUNTER — Other Ambulatory Visit: Payer: Self-pay

## 2024-01-31 ENCOUNTER — Other Ambulatory Visit (HOSPITAL_COMMUNITY): Payer: Self-pay

## 2024-01-31 NOTE — Telephone Encounter (Signed)
 error

## 2024-02-01 ENCOUNTER — Other Ambulatory Visit: Payer: Self-pay

## 2024-02-01 ENCOUNTER — Telehealth: Payer: Self-pay

## 2024-02-01 NOTE — Telephone Encounter (Signed)
 Copied from CRM [id number]. Topic: Clinical - Medication Prior Auth >> Jan 30, 2024 11:24 AM Chasity T wrote: Reason for CRM: Patient is needing auth for medication Semaglutide -Weight Management (WEGOVY ) 0.25 MG/0.5ML SOAJ. States her insurance needs to be contacted by doctor.

## 2024-02-02 ENCOUNTER — Telehealth: Payer: Self-pay | Admitting: Pharmacy Technician

## 2024-02-02 ENCOUNTER — Other Ambulatory Visit (HOSPITAL_COMMUNITY): Payer: Self-pay

## 2024-02-02 NOTE — Telephone Encounter (Signed)
 Pharmacy Patient Advocate Encounter  Received notification from [facility] [employer] that Prior Authorization for Wegovy  0.25MG /0.5ML auto-injectors has been APPROVED from 01/30/2024 to 07/28/2024. Unable to obtain price due to refill too soon rejection, last fill date 01/30/2024 next available fill date06/30/2025.   PA #/Case ID/Reference #: Key: [id number]

## 2024-02-02 NOTE — Telephone Encounter (Signed)
 PA request has been Approved. New Encounter has been or will be created for follow up. For additional info see Pharmacy Prior Auth telephone encounter from 02/02/2024.

## 2024-02-05 ENCOUNTER — Other Ambulatory Visit: Payer: Self-pay

## 2024-02-14 ENCOUNTER — Other Ambulatory Visit: Payer: Self-pay

## 2024-02-14 ENCOUNTER — Other Ambulatory Visit: Payer: Self-pay | Admitting: Family Medicine

## 2024-02-14 ENCOUNTER — Ambulatory Visit: Payer: Self-pay

## 2024-02-14 NOTE — Telephone Encounter (Signed)
 FYI Only or Action Required?: FYI only for provider.  Patient was last seen in primary care on 01/26/2024 by Gasper [name], MD. Called Nurse Triage reporting Rash. Symptoms began several weeks ago. Interventions attempted: Nothing. Symptoms are: gradually worsening.  Triage Disposition: See PCP When Office is Open (Within 3 Days)  Patient/caregiver understands and will follow disposition?: Yes      Summary: allergic reation   Patient has red blotches/patches that are itchy and anytime she scratches it spreads and itches more and the patient states this seems like it started after she has started taking the wegovy  medication itching and spots are increasing     Reason for Disposition  [1] Severe localized itching AND [2] after 2 days of steroid cream    Moderate itching - pt is afraid the rash will spread  Answer Assessment - Initial Assessment Questions 1. APPEARANCE of RASH: Describe the rash.      Red patches above elbow, raised bumps 2. LOCATION: Where is the rash located?     Above left elbow 3. NUMBER: How many spots are there?      unknown 4. SIZE: How big are the spots? (Inches, centimeters or compare to size of a coin)      N/ASSESSMENT: 5. ONSET: When did the rash start?      3 weeks 6. ITCHING: Does the rash itch? If Yes, ask: How bad is the itch?  (Scale 0-10; or none, mild, moderate, severe)     moderate 7. PAIN: Does the rash hurt? If Yes, ask: How bad is the pain?  (Scale 0-10; or none, mild, moderate, severe)    - NONE (0): no pain    - MILD (1-3): doesn't interfere with normal activities     - MODERATE (4-7): interferes with normal activities or awakens from sleep     - SEVERE (8-10): excruciating pain, unable to do any normal activities     no 8. OTHER SYMPTOMS: Do you have any other symptoms? (e.g., fever)     no 9. PREGNANCY: Is there any chance you are pregnant? When was your last menstrual period?     N/a Pt stated she began  wegovy  x 3 weeks ago and the rash started right after.  Pt is not sure if the medication is causing a reaction or not.  Protocols used: Rash or Redness - Localized-A-AH

## 2024-02-15 ENCOUNTER — Other Ambulatory Visit: Payer: Self-pay | Admitting: Family Medicine

## 2024-02-15 ENCOUNTER — Ambulatory Visit: Admitting: Family Medicine

## 2024-02-15 ENCOUNTER — Other Ambulatory Visit: Payer: Self-pay

## 2024-02-15 ENCOUNTER — Encounter: Payer: Self-pay | Admitting: Family Medicine

## 2024-02-15 VITALS — BP 114/75 | HR 87 | Ht 66.0 in | Wt 267.0 lb

## 2024-02-15 DIAGNOSIS — W57XXXA Bitten or stung by nonvenomous insect and other nonvenomous arthropods, initial encounter: Secondary | ICD-10-CM | POA: Diagnosis not present

## 2024-02-15 DIAGNOSIS — S40862A Insect bite (nonvenomous) of left upper arm, initial encounter: Secondary | ICD-10-CM

## 2024-02-15 MED ORDER — SEMAGLUTIDE-WEIGHT MANAGEMENT 0.5 MG/0.5ML ~~LOC~~ SOAJ
0.5000 mg | SUBCUTANEOUS | 1 refills | Status: DC
Start: 2024-03-14 — End: 2024-04-01
  Filled 2024-02-15: qty 6, 84d supply, fill #0
  Filled 2024-02-16 – 2024-02-21 (×2): qty 2, 28d supply, fill #0
  Filled 2024-03-18: qty 2, 28d supply, fill #1

## 2024-02-15 MED ORDER — HYDROCORTISONE 2.5 % EX OINT
TOPICAL_OINTMENT | Freq: Two times a day (BID) | CUTANEOUS | 3 refills | Status: AC
  Filled 2024-02-15: qty 28.35, 15d supply, fill #0
  Filled 2024-02-25 – 2024-02-26 (×2): qty 28.35, 15d supply, fill #1

## 2024-02-15 NOTE — Progress Notes (Signed)
 ACUTE VISIT   Patient: Wendy Ferguson   DOB: Mar 23, 1981   43 y.o. Female  MRN: [id number]   PCP: Gasper Nancyann FORBES, MD  Chief Complaint  Patient presents with   Rash    Red itchy spots under her L arm this happened today    Subjective    HPI HPI     Rash    Additional comments: Red itchy spots under her L arm this happened today       Last edited by Thelbert Eulalio HERO, CMA on 02/15/2024 10:49 AM.       Discussed the use of AI scribe software for clinical note transcription with the patient, who gave verbal consent to proceed.  History of Present Illness Wendy Ferguson is a 43 year old female who presents with red and itchy spots under her left arm.  She experienced a sudden onset of red and itchy spots under her left arm last night. The itching intensifies with sweating and subsides upon cooling. The spots resemble insect bites, but she does not recall any bites. There is no associated pain.  She applied a cold compress to the area, which alleviated the itching. She is concerned about a potential reaction to her new medication, Wegovy , which she recently started for weight loss. She administers the injections in her abdomen, and this is her only new medication. She has not had similar reactions previously.  She also has a bruise on her elbow, which was more inflamed earlier. She recalls seeing a long, brown bug on her elbow a night or two ago but is unsure of its relevance to her symptoms.  Her current medications include Wegovy , with a recent request for a higher dose, and a headache medication administered via injection in her abdomen, with no reported side effects.     Medications: Outpatient Medications Prior to Visit  Medication Sig   divalproex  (DEPAKOTE ) 500 MG DR tablet Take 1 tablet (500 mg total) by mouth 3 (three) times daily.   Elastic Bandages & Supports (AIRCAST SPORT ANKLE BRACE/LEFT) MISC 1 each by Does not apply route daily.   levocetirizine  (XYZAL ) 5 MG tablet Take 1 tablet (5 mg total) by mouth every evening.   meloxicam  (MOBIC ) 7.5 MG tablet Take 1 tablet (7.5 mg total) by mouth 2 (two) times daily with a meal.   pregabalin  (LYRICA ) 100 MG capsule Take 1 capsule (100 mg total) by mouth at bedtime   pregabalin  (LYRICA ) 100 MG capsule Take 1 capsule (100 mg total) by mouth 2 (two) times daily   [DISCONTINUED] Semaglutide -Weight Management (WEGOVY ) 0.25 MG/0.5ML SOAJ Inject 0.25 mg into the skin once a week.   No facility-administered medications prior to visit.        Objective    BP 114/75   Pulse 87   Ht 5' 6 (1.676 m)   Wt 267 lb (121.1 kg)   SpO2 95%   BMI 43.09 kg/m    Physical Exam   Physical Exam SKIN: Red and itchy spots under left arm. No signs of cellulitis under left arm.   No results found for any visits on 02/15/24.  Assessment & Plan      Assessment & Plan Localized Dermatitis Acute onset of erythematous and pruritic lesions under the left axilla, exacerbated by perspiration and alleviated by cooling. Differential includes insect bite or contact dermatitis. No pain, systemic symptoms, cellulitis, or systemic reaction. Unlikely related to Wegovy  injections due to localization away from injection  site. Cold compresses provide symptomatic relief. - Apply over-the-counter 1% hydrocortisone  cream to affected area 2-3 times daily as needed for pruritus. - Prescribe 2.5% hydrocortisone  cream if symptoms persist with 1% cream. - Continue cold compresses for pruritus relief. - Maintain hygiene by keeping the area clean and dry.  Chronic Pain Chronic pain management challenges. Previous pain clinic injections exacerbated symptoms. She is scheduled to return to the pain clinic but is reluctant to receive further injections. Prefers to avoid surgery unless absolutely necessary. - Discuss alternative pain management strategies with the pain clinic.  Headache Management Initiated self-injection therapy for  headaches. No adverse effects reported. Awaiting assessment of therapeutic efficacy. - Evaluate effectiveness of headache medication.  Weight Management with Semaglutide  (Wegovy ) Currently on Wegovy  for weight management at 0.25 mg, with planned increase to 0.5 mg post current supply. No adverse reactions reported. Rash is unlikely related to Wegovy  due to localization away from injection site. - Continue current Wegovy  dose until supply is depleted, then increase to 0.5 mg as planned. - Ensure prescription refill for 0.5 mg dose is processed.  Seizure Disorder Seizure disorder with no current symptomatology or management changes discussed.      Return if symptoms worsen or fail to improve, for rash .        Rockie Agent, MD  [facility] [phone] (phone) [phone] (fax)  [facility]

## 2024-02-15 NOTE — Patient Instructions (Addendum)
 Please apply over the counter hydrocortisone 1% to the area twice daily and it should resolve in the next 10-14 days

## 2024-02-16 ENCOUNTER — Other Ambulatory Visit: Payer: Self-pay

## 2024-02-21 ENCOUNTER — Other Ambulatory Visit: Payer: Self-pay

## 2024-02-25 ENCOUNTER — Other Ambulatory Visit: Payer: Self-pay

## 2024-03-09 ENCOUNTER — Emergency Department
Admission: EM | Admit: 2024-03-09 | Discharge: 2024-03-09 | Disposition: A | Discharge status: Home / Self Care | Attending: Emergency Medicine | Admitting: Emergency Medicine

## 2024-03-09 ENCOUNTER — Other Ambulatory Visit: Payer: Self-pay

## 2024-03-09 DIAGNOSIS — S90861A Insect bite (nonvenomous), right foot, initial encounter: Secondary | ICD-10-CM | POA: Diagnosis present

## 2024-03-09 DIAGNOSIS — W57XXXA Bitten or stung by nonvenomous insect and other nonvenomous arthropods, initial encounter: Secondary | ICD-10-CM | POA: Insufficient documentation

## 2024-03-09 MED ORDER — PREDNISONE 10 MG (21) PO TBPK: ORAL_TABLET | ORAL | 0 refills | Status: DC

## 2024-03-09 MED ORDER — CEPHALEXIN 500 MG PO CAPS: 500.0000 mg | ORAL_CAPSULE | Freq: Three times a day (TID) | ORAL | 0 refills | Status: AC

## 2024-03-09 NOTE — ED Triage Notes (Signed)
 Pt states insect bite of unknown insect to R foot. Pt denies fevers.

## 2024-03-09 NOTE — ED Provider Notes (Signed)
 [facility] Provider Note    Event Date/Time   First MD Initiated Contact with Patient 03/09/24 1028     (approximate)   History   Insect Bite   HPI  Wendy Ferguson is a 43 y.o. female history of H. pylori, seizures presents emergency department with a bug bite to the right foot.  Patient states happened about 6 days ago.  Area is still red and has little blisters on it.  Some itching.  Used hydrocortisone  cream without any relief.  No fever or chills.      Physical Exam   Triage Vital Signs: ED Triage Vitals  Encounter Vitals Group     BP 03/09/24 1019 (!) 149/86     Girls Systolic BP Percentile --      Girls Diastolic BP Percentile --      Boys Systolic BP Percentile --      Boys Diastolic BP Percentile --      Pulse Rate 03/09/24 1019 100     Resp 03/09/24 1019 18     Temp 03/09/24 1019 98.2 F (36.8 C)     Temp Source 03/09/24 1019 Oral     SpO2 03/09/24 1019 95 %     Weight 03/09/24 1019 266 lb 12.1 oz (121 kg)     Height 03/09/24 1019 5' 6 (1.676 m)     Head Circumference --      Peak Flow --      Pain Score 03/09/24 1020 8     Pain Loc --      Pain Education --      Exclude from Growth Chart --     Most recent vital signs: Vitals:   03/09/24 1019 03/09/24 1100  BP: (!) 149/86 138/88  Pulse: 100 94  Resp: 18 17  Temp: 98.2 F (36.8 C) 98.1 F (36.7 C)  SpO2: 95% 96%     General: Awake, no distress.   CV:  Good peripheral perfusion. Resp:  Normal effort.  Abd:  No distention.   Other:  Right foot small red area about the size of a nickel with small blisters noted on top, no streaks noted around the area, no drainage   ED Results / Procedures / Treatments   Labs (all labs ordered are listed, but only abnormal results are displayed) Labs Reviewed - No data to display   EKG     RADIOLOGY     PROCEDURES:   Procedures  Critical Care:  no Chief Complaint  Patient presents with   Insect Bite       MEDICATIONS ORDERED IN ED: Medications - No data to display   IMPRESSION / MDM / ASSESSMENT AND PLAN / ED COURSE  I reviewed the triage vital signs and the nursing notes.                              Differential diagnosis includes, but is not limited to, insect bite, cellulitis, snake bite  Patient's presentation is most consistent with acute illness / injury with system symptoms.    Area appears to be a mild insect bite of some sort.  Does not appear to be snakebite.  Area has small amount of cellulitis but appears to be localized.  Patient is complaining of a lot of itching so we will go ahead and place her on a steroid pack.  She is really concerned about infection.  Explained to her that  it does not appear to be a sign of infection now but if the redness does spread she could get the antibiotic filled.  She is in agreement with this treatment plan.  Discharged stable condition.      FINAL CLINICAL IMPRESSION(S) / ED DIAGNOSES   Final diagnoses:  Insect bite of right foot, initial encounter     Rx / DC Orders   ED Discharge Orders          Ordered    predniSONE  (STERAPRED UNI-PAK 21 TAB) 10 MG (21) TBPK tablet        03/09/24 1052    cephALEXin  (KEFLEX ) 500 MG capsule  3 times daily        03/09/24 1052             Note:  This document was prepared using Dragon voice recognition software and may include unintentional dictation errors.    Gasper Devere ORN, PA-C 03/09/24 1101    Jossie Artist POUR, MD 03/10/24 [phone]

## 2024-03-18 ENCOUNTER — Other Ambulatory Visit: Payer: Self-pay

## 2024-03-19 ENCOUNTER — Other Ambulatory Visit: Payer: Self-pay

## 2024-03-24 ENCOUNTER — Other Ambulatory Visit: Payer: Self-pay

## 2024-03-25 ENCOUNTER — Other Ambulatory Visit: Payer: Self-pay

## 2024-03-25 MED ORDER — DIVALPROEX SODIUM 500 MG PO DR TAB
500.0000 mg | DELAYED_RELEASE_TABLET | Freq: Three times a day (TID) | ORAL | 0 refills | Status: AC
  Filled 2024-03-25: qty 270, 90d supply, fill #0

## 2024-04-01 ENCOUNTER — Other Ambulatory Visit: Payer: Self-pay

## 2024-04-01 ENCOUNTER — Encounter: Payer: Self-pay | Admitting: Family Medicine

## 2024-04-01 ENCOUNTER — Ambulatory Visit
Admission: RE | Admit: 2024-04-01 | Discharge: 2024-04-01 | Disposition: A | Discharge status: Home / Self Care | Source: Ambulatory Visit | Attending: Family Medicine | Admitting: Family Medicine

## 2024-04-01 ENCOUNTER — Ambulatory Visit
Admission: RE | Admit: 2024-04-01 | Discharge: 2024-04-01 | Disposition: A | Discharge status: Home / Self Care | Attending: Family Medicine | Admitting: Family Medicine

## 2024-04-01 ENCOUNTER — Ambulatory Visit (INDEPENDENT_AMBULATORY_CARE_PROVIDER_SITE_OTHER): Admitting: Family Medicine

## 2024-04-01 DIAGNOSIS — R200 Anesthesia of skin: Secondary | ICD-10-CM | POA: Diagnosis not present

## 2024-04-01 DIAGNOSIS — R202 Paresthesia of skin: Secondary | ICD-10-CM | POA: Diagnosis present

## 2024-04-01 DIAGNOSIS — M542 Cervicalgia: Secondary | ICD-10-CM | POA: Diagnosis not present

## 2024-04-01 MED ORDER — WEGOVY 1 MG/0.5ML ~~LOC~~ SOAJ
1.0000 mg | SUBCUTANEOUS | 3 refills | Status: AC
  Filled 2024-04-01 – 2024-04-11 (×2): qty 2, 28d supply, fill #0
  Filled 2024-05-08: qty 2, 28d supply, fill #1
  Filled 2024-06-05 – 2024-06-18 (×5): qty 2, 28d supply, fill #2

## 2024-04-01 MED ORDER — METHOCARBAMOL 500 MG PO TABS
1000.0000 mg | ORAL_TABLET | Freq: Every evening | ORAL | 0 refills | Status: DC | PRN
Start: 2024-04-01 — End: 2024-04-15
  Filled 2024-04-01: qty 30, 15d supply, fill #0

## 2024-04-01 NOTE — Progress Notes (Signed)
 Established patient visit   Patient: Wendy Ferguson   DOB: 08-12-1981   43 y.o. Female  MRN: [id number] Visit Date: 04/01/2024  Today's healthcare provider: Nancyann Perry, MD   Chief Complaint  Patient presents with   Follow-up    -Patient is here to follow up on Wegovy , states that she is feeling fine and not eating as much.  -States she is also concerned while she is sleeping that her hands and arm seem to start tingling.  It bothers her so much to the point to where she has to get in a chair to sleep.  Declined any vaccines.   Subjective    Discussed the use of AI scribe software for clinical note transcription with the patient, who gave verbal consent to proceed.  History of Present Illness   Wendy Ferguson is a 43 year old female who presents for follow-up on Wegovy  treatment.  She started Wegovy  in early June and has titrated from 0.25 mg to 0.5 mg weekly. There is a decrease in appetite and she has lost about nine pounds since her last visit when she weighed 270 pounds. She experiences some constipation but no nausea. She has two doses of 0.5 mg left.  She experiences tingling in her right arm and hand, which occurs mainly at night and sometimes affects her left hand. The tingling starts when she lays her head on her arm, causing her hand to 'go to sleep' and tingle through her fingers, affecting her sleep. No weakness in her arm but there is occasional neck pain without a specific side being worse. She has been taking pregabalin  for her feet, which were also going to sleep at night, and acetaminophen  for pain. She previously tried meloxicam  for her ankle but stopped after several days as it was ineffective.     Wt Readings from Last 5 Encounters:  04/01/24 261 lb 3.2 oz (118.5 kg)  03/09/24 266 lb 12.1 oz (121 kg)  02/15/24 267 lb (121.1 kg)  01/26/24 269 lb 11.2 oz (122.3 kg)  01/02/24 269 lb 11.2 oz (122.3 kg)     Medications: Outpatient Medications Prior  to Visit  Medication Sig   divalproex  (DEPAKOTE ) 500 MG DR tablet Take 1 tablet (500 mg total) by mouth 3 (three) times daily.   Elastic Bandages & Supports (AIRCAST SPORT ANKLE BRACE/LEFT) MISC 1 each by Does not apply route daily.   hydrocortisone  2.5 % ointment Apply topically 2 (two) times daily. As needed for mild skin.  Do not use for more than 1-2 weeks at a time.   levocetirizine (XYZAL ) 5 MG tablet Take 1 tablet (5 mg total) by mouth every evening.   meloxicam  (MOBIC ) 7.5 MG tablet Take 1 tablet (7.5 mg total) by mouth 2 (two) times daily with a meal.   pregabalin  (LYRICA ) 100 MG capsule Take 1 capsule (100 mg total) by mouth at bedtime   pregabalin  (LYRICA ) 100 MG capsule Take 1 capsule (100 mg total) by mouth 2 (two) times daily   Semaglutide -Weight Management 0.5 MG/0.5ML SOAJ Inject 0.5 mg into the skin once a week.   No facility-administered medications prior to visit.   Review of Systems  Constitutional:  Negative for appetite change, chills, fatigue and fever.  Respiratory:  Negative for chest tightness and shortness of breath.   Cardiovascular:  Negative for chest pain and palpitations.  Gastrointestinal:  Negative for abdominal pain, nausea and vomiting.  Neurological:  Negative for dizziness and weakness.  Objective    BP 99/70 (BP Location: Right Arm, Patient Position: Sitting, Cuff Size: Normal)   Pulse 80   Temp 98.6 F (37 C) (Oral)   Ht 5' 6 (1.676 m)   Wt 261 lb 3.2 oz (118.5 kg)   SpO2 97%   BMI 42.16 kg/m   Physical Exam   General: Appearance:    Severely obese female in no acute distress  Eyes:    PERRL, conjunctiva/corneas clear, EOM's intact       Lungs:     Clear to auscultation bilaterally, respirations unlabored  Heart:    Normal heart rate. Normal rhythm. No murmurs, rubs, or gallops.    MS:   All extremities are intact.  Mild tenderness right paracervical muscles.   Neurologic:   Awake, alert, oriented x 3. No apparent focal  neurological defect.        Assessment & Plan     1. Morbid obesity (HCC) (Primary) Doing well with 0.mg Wegovy . Increase to semaglutide -weight management (WEGOVY ) 1 MG/0.5ML SOAJ SQ injection; Inject 1 mg into the skin once a week.  Dispense: 2 mL; Refill: 3  Recheck in 3 months.   2. Neck pain on right side  - methocarbamol  (ROBAXIN ) 500 MG tablet; Take 2 tablets (1,000 mg total) by mouth at bedtime as needed for muscle spasms.  Dispense: 30 tablet; Refill: 0  3. Numbness and tingling of right arm Possible secondary to cervical nerve irritation.   - DG Cervical Spine Complete; Future      Nancyann Perry, MD  [facility] [phone] (phone) [phone] (fax)  [facility]

## 2024-04-01 NOTE — Patient Instructions (Signed)
 Wendy Ferguson  Please review the attached list of medications and notify my office if there are any errors.   . Please bring all of your medications to every appointment so we can make sure that our medication list is the same as yours.

## 2024-04-03 ENCOUNTER — Other Ambulatory Visit: Payer: Self-pay

## 2024-04-03 MED ORDER — METHYLPREDNISOLONE 4 MG PO TBPK
ORAL_TABLET | ORAL | 0 refills | Status: DC
  Filled 2024-04-03: qty 21, 6d supply, fill #0

## 2024-04-11 ENCOUNTER — Other Ambulatory Visit: Payer: Self-pay

## 2024-04-11 ENCOUNTER — Other Ambulatory Visit: Payer: Self-pay | Admitting: Family Medicine

## 2024-04-11 DIAGNOSIS — M542 Cervicalgia: Secondary | ICD-10-CM

## 2024-04-15 ENCOUNTER — Other Ambulatory Visit: Payer: Self-pay | Admitting: Family Medicine

## 2024-04-15 DIAGNOSIS — M542 Cervicalgia: Secondary | ICD-10-CM

## 2024-04-15 NOTE — Telephone Encounter (Unsigned)
 Copied from CRM #[id number]. Topic: Clinical - Medication Refill >> Apr 15, 2024 11:51 AM Selinda RAMAN wrote: Medication: methocarbamol  (ROBAXIN ) 500 MG tablet  Has the patient contacted their pharmacy? Yes   This is the patient's preferred pharmacy:  [facility] [address] [place] [address] Phone: [phone] Fax: [phone]   Is this the correct pharmacy for this prescription? Yes If no, delete pharmacy and type the correct one.   Has the prescription been filled recently? No  Is the patient out of the medication? No but she only has enough to last her until tomorrow  Has the patient been seen for an appointment in the last year OR does the patient have an upcoming appointment? Yes  Can we respond through MyChart? Yes  Agent: Please be advised that Rx refills may take up to 3 business days. We ask that you follow-up with your pharmacy.

## 2024-04-16 ENCOUNTER — Other Ambulatory Visit: Payer: Self-pay

## 2024-04-16 MED ORDER — METHOCARBAMOL 500 MG PO TABS
1000.0000 mg | ORAL_TABLET | Freq: Every evening | ORAL | 0 refills | Status: DC | PRN
Start: 2024-04-16 — End: 2024-07-03
  Filled 2024-04-16: qty 30, 15d supply, fill #0

## 2024-04-16 NOTE — Telephone Encounter (Signed)
 Duplicate request, L[facility] 04/16/24.  Requested Prescriptions  Pending Prescriptions Disp Refills   methocarbamol  (ROBAXIN ) 500 MG tablet 30 tablet 0    Sig: Take 2 tablets (1,000 mg total) by mouth at bedtime as needed for muscle spasms.     Not Delegated - Analgesics:  Muscle Relaxants Failed - 04/16/2024  3:43 PM      Failed - This refill cannot be delegated      Passed - Valid encounter within last 6 months    Recent Outpatient Visits           2 weeks ago Morbid obesity [facility])   [facility] Gasper [name], MD   2 months ago Insect bite of left upper arm, initial encounter    [facility] Simmons[place]Robinson, Elsie, MD   2 months ago Plantar fasciitis of right foot   [facility] Gasper [name], MD   3 months ago Chronic pain of left ankle   [facility] [place], Lauraine SAILOR, DO

## 2024-04-18 ENCOUNTER — Ambulatory Visit: Payer: Self-pay | Admitting: Family Medicine

## 2024-05-08 ENCOUNTER — Other Ambulatory Visit: Payer: Self-pay

## 2024-05-14 ENCOUNTER — Other Ambulatory Visit: Payer: Self-pay

## 2024-05-14 MED ORDER — PREGABALIN 100 MG PO CAPS
ORAL_CAPSULE | ORAL | 2 refills | Status: AC
  Filled 2024-05-14: qty 90, 30d supply, fill #0

## 2024-05-21 ENCOUNTER — Ambulatory Visit (INDEPENDENT_AMBULATORY_CARE_PROVIDER_SITE_OTHER): Admitting: Physician Assistant

## 2024-05-21 ENCOUNTER — Ambulatory Visit: Payer: Self-pay

## 2024-05-21 ENCOUNTER — Other Ambulatory Visit: Payer: Self-pay

## 2024-05-21 VITALS — BP 102/79 | HR 96 | Resp 14 | Ht 66.0 in | Wt 251.7 lb

## 2024-05-21 DIAGNOSIS — L03213 Periorbital cellulitis: Secondary | ICD-10-CM

## 2024-05-21 MED ORDER — AMOXICILLIN-POT CLAVULANATE 875-125 MG PO TABS
1.0000 | ORAL_TABLET | Freq: Two times a day (BID) | ORAL | 0 refills | Status: DC
  Filled 2024-05-21: qty 20, 10d supply, fill #0

## 2024-05-21 NOTE — Progress Notes (Signed)
 Established patient visit  Patient: Wendy Ferguson   DOB: 12-Oct-1980   43 y.o. Female  MRN: [id number] Visit Date: 05/21/2024  Today's healthcare provider: Jolynn Spencer, PA-C   Chief Complaint  Patient presents with   Blepharitis    Onset last night with L eye itching worse at night. Swelling. Has tried cold rag and help a little, felt some fluid come out of eye   Subjective     Discussed the use of AI scribe software for clinical note transcription with the patient, who gave verbal consent to proceed.  History of Present Illness Ashtan E Fils is a 43 year old female who presents with a swollen, painful, and itchy left eye. She is accompanied by her fiance.  Her left eye symptoms began yesterday with significant swelling, pain, and itching. This morning, she used a cold washcloth to reduce the swelling. She experiences pain when touching the area around the eye, especially on the side, and has blurry vision. There is drainage from the side of her face and a strong urge to scratch the eye.  She recently recovered from a cold and has allergies to pet dander and pollen. There is no history of trauma to the eye or known medication allergies, including penicillin.       04/01/2024    9:42 AM 02/15/2024   10:50 AM 01/02/2024    2:27 PM  Depression screen PHQ 2/9  Decreased Interest 0 3 2  Down, Depressed, Hopeless 0 0 0  PHQ - 2 Score 0 3 2  Altered sleeping 0 1 2  Tired, decreased energy 0 0 0  Change in appetite 0 2 0  Feeling bad or failure about yourself  0 0 0  Trouble concentrating 0 0 0  Moving slowly or fidgety/restless 0 0 1  Suicidal thoughts 0 0 0  PHQ-9 Score 0 6 5  Difficult doing work/chores Not difficult at all Not difficult at all Somewhat difficult      04/01/2024    9:43 AM 02/15/2024   10:51 AM 01/02/2024    2:27 PM  GAD 7 : Generalized Anxiety Score  Nervous, Anxious, on Edge 0 0 0  Control/stop worrying 1 0 0  Worry too much - different things 0  0 0  Trouble relaxing 0 0 0  Restless 0 0 0  Easily annoyed or irritable 0 3 0  Afraid - awful might happen 0 0 0  Total GAD 7 Score 1 3 0  Anxiety Difficulty Not difficult at all Not difficult at all Not difficult at all    Medications: Outpatient Medications Prior to Visit  Medication Sig   divalproex  (DEPAKOTE ) 500 MG DR tablet Take 1 tablet (500 mg total) by mouth 3 (three) times daily.   Elastic Bandages & Supports (AIRCAST SPORT ANKLE BRACE/LEFT) MISC 1 each by Does not apply route daily.   hydrocortisone  2.5 % ointment Apply topically 2 (two) times daily. As needed for mild skin.  Do not use for more than 1-2 weeks at a time.   levocetirizine (XYZAL ) 5 MG tablet Take 1 tablet (5 mg total) by mouth every evening.   meloxicam  (MOBIC ) 7.5 MG tablet Take 1 tablet (7.5 mg total) by mouth 2 (two) times daily with a meal.   methocarbamol  (ROBAXIN ) 500 MG tablet Take 2 tablets (1,000 mg total) by mouth at bedtime as needed for muscle spasms.   methylPREDNISolone  (MEDROL  DOSEPAK) 4 MG TBPK tablet Follow package directions.   pregabalin  (LYRICA ) 100 MG  capsule Take 1 capsule (100 mg total) by mouth at bedtime   pregabalin  (LYRICA ) 100 MG capsule Take 1 capsule (100 mg total) by mouth 2 (two) times daily   pregabalin  (LYRICA ) 100 MG capsule Take 1 capsule (100 mg total) by mouth every morning AND 2 capsules (200 mg total) every evening.   semaglutide -weight management (WEGOVY ) 1 MG/0.5ML SOAJ SQ injection Inject 1 mg into the skin once a week.   No facility-administered medications prior to visit.    Review of Systems All negative Except see HPI       Objective    BP 102/79   Pulse 96   Resp 14   Ht 5' 6 (1.676 m)   Wt 251 lb 11.2 oz (114.2 kg)   SpO2 99%   BMI 40.63 kg/m     Physical Exam Vitals reviewed.  Constitutional:      General: She is not in acute distress.    Appearance: Normal appearance. She is well-developed. She is not diaphoretic.  HENT:     Head:  Normocephalic and atraumatic.   Eyes:     General: No scleral icterus.    Conjunctiva/sclera: Conjunctivae normal.  Neck:     Thyroid: No thyromegaly.  Cardiovascular:     Rate and Rhythm: Normal rate and regular rhythm.     Pulses: Normal pulses.     Heart sounds: Normal heart sounds. No murmur heard. Pulmonary:     Effort: Pulmonary effort is normal. No respiratory distress.     Breath sounds: Normal breath sounds. No wheezing, rhonchi or rales.  Musculoskeletal:     Cervical back: Neck supple.     Right lower leg: No edema.     Left lower leg: No edema.  Lymphadenopathy:     Cervical: No cervical adenopathy.  Skin:    General: Skin is warm and dry.     Findings: No rash.  Neurological:     Mental Status: She is alert and oriented to person, place, and time. Mental status is at baseline.  Psychiatric:        Mood and Affect: Mood normal.        Behavior: Behavior normal.      No results found for any visits on 05/21/24.      Assessment & Plan Left periorbital swelling and pain, likely periorbital cellulitis with allergic/viral conjunctivitis/blephiritis after recent viral infection Recent upper respiratory infection and allergies to pet dander and pollen may contribute. Differential includes bacterial infection, viral infection, and allergic reaction. - Prescribe systemic antibiotics for 10 days. - Recommend over-the-counter allergy eye drops such as Zaditor  or Pataday. - Advise use of over-the-counter antihistamines like Allegra or Claritin. - Instruct on warm compresses for symptomatic relief. - Advise to return if symptoms worsen or vision problems develop.  No orders of the defined types were placed in this encounter.   No follow-ups on file.   The patient was advised to call back or seek an in-person evaluation if the symptoms worsen or if the condition fails to improve as anticipated.  I discussed the assessment and treatment plan with the patient. The  patient was provided an opportunity to ask questions and all were answered. The patient agreed with the plan and demonstrated an understanding of the instructions.  I, Analaura Messler, PA-C have reviewed all documentation for this visit. The documentation on 05/21/2024  for the exam, diagnosis, procedures, and orders are all accurate and complete.  Jolynn Spencer, [facility], MMS [place] [phone] (phone)  [phone] (fax)  [facility]

## 2024-05-21 NOTE — Telephone Encounter (Signed)
 FYI Only or Action Required?: Action required by provider: request for appointment.  Patient was last seen in primary care on 04/01/2024 by Gasper [name], MD.  Called Nurse Triage reporting eye symptoms.  Symptoms began yesterday.  Interventions attempted: Nothing.  Symptoms are: unchanged.  Triage Disposition: See HCP Within 4 Hours (Or PCP Triage)  Patient/caregiver understands and will follow disposition?: YesCopied from CRM #[id number]. Topic: Clinical - Red Word Triage >> May 21, 2024 11:06 AM Larissa RAMAN wrote: [facility] that prompted transfer to Nurse Triage: Lt eye swelling Reason for Disposition  [1] SEVERE eyelid swelling on one side AND [2] red and painful (or tender to touch)  Answer Assessment - Initial Assessment Questions 1. ONSET: When did the swelling start? (e.g., minutes, hours, days)     today 2. LOCATION: What part of the eyelids is swollen?     Left lower lid 3. SEVERITY: How swollen is it?     moderate 4. ITCHING: Is there any itching? If Yes, ask: How much?   (Scale 1-10; mild, moderate or severe)     mild 5. PAIN: Is the swelling painful to touch? If Yes, ask: How painful is it?   (Scale 1-10; mild, moderate or severe)     no 6. FEVER: Do you have a fever? If Yes, ask: What is it, how was it measured, and when did it start?      no 7. CAUSE: What do you think is causing the swelling?     unsure 8. RECURRENT SYMPTOM: Have you had eyelid swelling before? If Yes, ask: When was the last time? What happened that time?     no 9. OTHER SYMPTOMS: Do you have any other symptoms? (e.g., blurred vision, eye discharge, rash, runny nose)     no 10. PREGNANCY: Is there any chance you are pregnant? When was your last menstrual period?       no  Protocols used: Eye - Swelling-A-AH

## 2024-05-23 ENCOUNTER — Encounter: Payer: Self-pay | Admitting: Physician Assistant

## 2024-06-05 ENCOUNTER — Other Ambulatory Visit: Payer: Self-pay

## 2024-06-05 ENCOUNTER — Other Ambulatory Visit (HOSPITAL_BASED_OUTPATIENT_CLINIC_OR_DEPARTMENT_OTHER): Payer: Self-pay

## 2024-06-05 ENCOUNTER — Telehealth: Payer: Self-pay

## 2024-06-05 NOTE — Telephone Encounter (Signed)
 Copied from CRM #[id number]. Topic: Clinical - Medication Prior Auth >> Jun 05, 2024 12:32 PM Montie POUR wrote: Reason for CRM:  Yena said that her pharmacy stated that she will need prior authorization for semaglutide -weight management (WEGOVY ) 1 MG/0.5ML SOAJ SQ injection. The authorization needs to state that she is taking medication that makes her gain weight and she has seizures. Semaglutide -weight management (WEGOVY ) 1 MG/0.5ML SOAJ SQ injection helps her not to gain weight. By not gaining weight, she breaths better. Please call Nickole with questions at [phone]. Thanks

## 2024-06-06 ENCOUNTER — Other Ambulatory Visit: Payer: Self-pay

## 2024-06-06 MED ORDER — PREGABALIN 100 MG PO CAPS
200.0000 mg | ORAL_CAPSULE | Freq: Every evening | ORAL | 11 refills | Status: AC
  Filled 2024-06-06 – 2024-07-01 (×2): qty 60, 30d supply, fill #0
  Filled 2024-07-30: qty 60, 30d supply, fill #1
  Filled 2024-09-11: qty 60, 30d supply, fill #2

## 2024-06-07 ENCOUNTER — Telehealth: Payer: Self-pay

## 2024-06-07 ENCOUNTER — Other Ambulatory Visit: Payer: Self-pay

## 2024-06-07 ENCOUNTER — Other Ambulatory Visit (HOSPITAL_COMMUNITY): Payer: Self-pay

## 2024-06-07 NOTE — Telephone Encounter (Signed)
 Effective October 1st, Medicaid discontinued coverage of GLP1 medications for weight loss (such as Wegovy  and Zepbound), unless the patient has a documented history of a heart attack or stroke. Zepbound will continue to be covered only for patients with moderate to severe sleep apnea (AHI 15-30) and a BMI greater than 40. Because of this change, the prior authorization team will not be submitting new PA requests for GLP1 medications prescribed for weight loss, as patients will be unable to continue therapy under Medicaid coverage.

## 2024-06-07 NOTE — Telephone Encounter (Signed)
 Pharmacy Patient Advocate Encounter   Received notification from Pt Calls Messages that prior authorization for Wegovy  is required/requested.   Insurance verification completed.   The patient is insured through [employer].   Per test claim: Effective October 1st, Medicaid will discontinue coverage of GLP1 medications for weight loss (such as Wegovy  and Zepbound), unless the patient has a documented history of a heart attack or stroke. Zepbound will continue to be covered only for patients with moderate to severe sleep apnea (AHI 15-30) and a BMI greater than 40. Because of this change, the prior authorization team will not be submitting new PA requests for GLP1 medications prescribed for weight loss, as patients will be unable to continue therapy under Medicaid coverage.

## 2024-06-07 NOTE — Telephone Encounter (Unsigned)
 Copied from CRM #[id number]. Topic: Clinical - Medication Prior Auth >> Jun 07, 2024 12:47 PM Amy B wrote: Patient called again asking if prior authorization has been initiated.  Please advise.

## 2024-06-18 ENCOUNTER — Other Ambulatory Visit: Payer: Self-pay

## 2024-06-24 ENCOUNTER — Telehealth: Payer: Self-pay

## 2024-06-24 NOTE — Progress Notes (Signed)
 Complex Care Management Note  Care Guide Note 06/24/2024 Name: Wendy Ferguson MRN: [id number] DOB: 1981-08-12  Wendy Ferguson is a 43 y.o. year old female who sees Fisher, Nancyann FORBES, MD for primary care. I reached out to Andrina E Leard by phone today to offer complex care management services.  [name] was given information about Complex Care Management services today including:   The Complex Care Management services include support from the care team which includes your Nurse Care Manager, Clinical Social Worker, or Pharmacist.  The Complex Care Management team is here to help remove barriers to the health concerns and goals most important to you. Complex Care Management services are voluntary, and the patient may decline or stop services at any time by request to their care team member.   Complex Care Management Consent Status: Patient did not agree to participate in complex care management services at this time.  Encounter Outcome:  Patient Refused  Dreama Agent [facility] VBCI Assistant Direct Dial: [phone]  Fax: [phone]

## 2024-07-01 ENCOUNTER — Other Ambulatory Visit: Payer: Self-pay

## 2024-07-03 ENCOUNTER — Encounter: Payer: Self-pay | Admitting: Family Medicine

## 2024-07-03 ENCOUNTER — Ambulatory Visit: Admitting: Family Medicine

## 2024-07-03 ENCOUNTER — Ambulatory Visit (INDEPENDENT_AMBULATORY_CARE_PROVIDER_SITE_OTHER): Admitting: Family Medicine

## 2024-07-03 ENCOUNTER — Other Ambulatory Visit: Payer: Self-pay

## 2024-07-03 DIAGNOSIS — Z90711 Acquired absence of uterus with remaining cervical stump: Secondary | ICD-10-CM | POA: Diagnosis not present

## 2024-07-03 DIAGNOSIS — G629 Polyneuropathy, unspecified: Secondary | ICD-10-CM

## 2024-07-03 DIAGNOSIS — M25572 Pain in left ankle and joints of left foot: Secondary | ICD-10-CM

## 2024-07-03 DIAGNOSIS — T148XXA Other injury of unspecified body region, initial encounter: Secondary | ICD-10-CM

## 2024-07-03 DIAGNOSIS — G40909 Epilepsy, unspecified, not intractable, without status epilepticus: Secondary | ICD-10-CM | POA: Diagnosis not present

## 2024-07-03 DIAGNOSIS — G8929 Other chronic pain: Secondary | ICD-10-CM

## 2024-07-03 DIAGNOSIS — G2581 Restless legs syndrome: Secondary | ICD-10-CM

## 2024-07-03 DIAGNOSIS — R5381 Other malaise: Secondary | ICD-10-CM

## 2024-07-03 MED ORDER — BLOOD PRESSURE KIT DEVI
1.0000 | Freq: Every day | 0 refills | Status: AC | PRN
  Filled 2024-07-03: qty 1, 30d supply, fill #0

## 2024-07-03 MED ORDER — ADULT BLOOD PRESSURE CUFF LG KIT: 1.0000 | PACK | Freq: Every day | 0 refills | Status: DC | PRN

## 2024-07-03 MED ORDER — PHENTERMINE HCL 30 MG PO CAPS
30.0000 mg | ORAL_CAPSULE | ORAL | 0 refills | Status: DC
  Filled 2024-07-03: qty 30, 30d supply, fill #0

## 2024-07-03 NOTE — Progress Notes (Signed)
 Established patient visit   Patient: Wendy Ferguson   DOB: [birth year]   43 y.o. Female  MRN: [id number] Visit Date: 07/03/2024  Today's healthcare provider: Rockie Agent, MD   Chief Complaint  Patient presents with   Establish Care    Patient is present for [facility] with new pcp.  Declines vaccines today, declines mammogram    Obesity    Patient's insurance stopped coverage of wegovy  due to not covering for weight loss, would like to try something else    Abrasion    Patient has a scratch from stray dog on back of left thigh, reports the bruising is down but is still red and is swelling a little, is painful to sit on and has been there for close to 2 weeks   Subjective     HPI     Establish Care    Additional comments: Patient is present for TOC with new pcp.  Declines vaccines today, declines mammogram         Obesity    Additional comments: Patient's insurance stopped coverage of wegovy  due to not covering for weight loss, would like to try something else         Abrasion    Additional comments: Patient has a scratch from stray dog on back of left thigh, reports the bruising is down but is still red and is swelling a little, is painful to sit on and has been there for close to 2 weeks      Last edited by Cherry Chiquita HERO, CMA on 07/03/2024  2:32 PM.       Discussed the use of AI scribe software for clinical note transcription with the patient, who gave verbal consent to proceed.  History of Present Illness Wendy Ferguson is a 42 year old female who presents to transfer care and address weight management issues.  She is seeking to transfer her care due to insurance changes affecting her current weight loss treatment. She has a BMI of 40.98 and was previously on semaglutide  1 mg, which helped her reduce her weight from 273 lbs to 249 lbs. However, since discontinuing the medication, her weight has increased to 253 lbs. She has difficulty managing  her diet without medication, stating 'I'm eating everything in the house.' She engages in walking almost every day but feels she needs medication to assist with weight loss.  She has a scratch on the back of her left thigh from a stray dog, which is red, swollen, and painful. The scratch has been present for about two weeks. She has been applying Neosporin and peroxide to the area. The scratch itches.  Her breathing has become heavier since discontinuing her weight loss medication, particularly after physical activity such as taking her dog outside. No wheezing or coughing, but it takes about ten minutes to catch her breath after exertion.  Her past medical history includes a seizure disorder for which she takes Keppra 500 mg three times daily. She also has a history of left ovarian cyst and is status post partial hysterectomy with a remaining cervical stump. She takes Lyrica  100 mg at bedtime for tendinitis in both legs, which causes her feet to go to sleep at night. She also has a history of vitamin D  deficiency, tobacco use, fatigue, headaches, and knee pain.  She has a history of allergic rhinitis for which she was taking Xyzal  5 mg every evening, but she has discontinued it as it was not effective. She  also uses hydrocortisone  2.5% for skin issues as needed.     Past Medical History:  Diagnosis Date   Complication of anesthesia 09/2012   pt had a seizure during partial hysterectomy surgery    Helicobacter pylori gastritis    Ovarian cyst    Seizures (HCC)     Medications: Outpatient Medications Prior to Visit  Medication Sig   divalproex  (DEPAKOTE ) 500 MG DR tablet Take 1 tablet (500 mg total) by mouth 3 (three) times daily.   Elastic Bandages & Supports (AIRCAST SPORT ANKLE BRACE/LEFT) MISC 1 each by Does not apply route daily.   hydrocortisone  2.5 % ointment Apply topically 2 (two) times daily. As needed for mild skin.  Do not use for more than 1-2 weeks at a time.   pregabalin   (LYRICA ) 100 MG capsule Take 1 capsule (100 mg total) by mouth at bedtime   pregabalin  (LYRICA ) 100 MG capsule Take 1 capsule (100 mg total) by mouth 2 (two) times daily   pregabalin  (LYRICA ) 100 MG capsule Take 1 capsule (100 mg total) by mouth every morning AND 2 capsules (200 mg total) every evening.   pregabalin  (LYRICA ) 100 MG capsule Take 2 capsules (200 mg total) by mouth at bedtime.   [DISCONTINUED] amoxicillin -clavulanate (AUGMENTIN) 875-125 MG tablet Take 1 tablet by mouth 2 (two) times daily.   [DISCONTINUED] levocetirizine (XYZAL ) 5 MG tablet Take 1 tablet (5 mg total) by mouth every evening.   [DISCONTINUED] meloxicam  (MOBIC ) 7.5 MG tablet Take 1 tablet (7.5 mg total) by mouth 2 (two) times daily with a meal.   [DISCONTINUED] methocarbamol  (ROBAXIN ) 500 MG tablet Take 2 tablets (1,000 mg total) by mouth at bedtime as needed for muscle spasms.   [DISCONTINUED] methylPREDNISolone  (MEDROL  DOSEPAK) 4 MG TBPK tablet Follow package directions.   semaglutide -weight management (WEGOVY ) 1 MG/0.5ML SOAJ SQ injection Inject 1 mg into the skin once a week. (Patient not taking: Reported on 07/03/2024)   No facility-administered medications prior to visit.    Review of Systems      Objective    BP 109/75 (BP Location: Left Arm, Patient Position: Sitting, Cuff Size: Normal)   Pulse 75   Temp (!) 97.3 F (36.3 C) (Oral)   Ht 5' 6 (1.676 m)   Wt 253 lb 14.4 oz (115.2 kg)   SpO2 100%   BMI 40.98 kg/m      Physical Exam Vitals reviewed.  Constitutional:      General: She is not in acute distress.    Appearance: Normal appearance. She is not ill-appearing.  Cardiovascular:     Rate and Rhythm: Normal rate and regular rhythm.  Pulmonary:     Effort: Pulmonary effort is normal. No respiratory distress.     Breath sounds: No wheezing, rhonchi or rales.  Skin:     Neurological:     Mental Status: She is alert and oriented to person, place, and time.  Psychiatric:        Mood and  Affect: Mood normal.        Behavior: Behavior normal.       No results found for any visits on 07/03/24.  Assessment & Plan     Problem List Items Addressed This Visit     Chronic pain of left ankle   Morbid obesity (HCC) - Primary   Relevant Medications   phentermine 30 MG capsule   Blood Pressure Monitoring (BLOOD PRESSURE KIT) DEVI   Neuropathy   Restless legs syndrome (RLS)   S/p partial hysterectomy  with remaining cervical stump   Seizure disorder (HCC)   Other Visit Diagnoses       Abrasion         Physical deconditioning           Assessment and Plan Assessment & Plan Morbid obesity due to excess calories BMI of 40.98. Previously on semaglutide  1 mg, which was effective for weight loss, but insurance no longer covers it. Weight increased from 249 lbs to 253 lbs after discontinuation. Discussed phentermine as an alternative, noting potential side effects such as increased blood pressure, heart rate, and sleep disturbances. Phentermine is typically used for 3 months at a time, with cycling off for a few months to prevent tolerance. Discussed the possibility of trying metformin to counteract weight gain from Depakote . Insurance coverage for phentermine is uncertain, but it is generally affordable. - Started phentermine 30 mg daily. - Will increase phentermine to 37.5 mg after one month if tolerated. - Monitor blood pressure and heart rate at home with a digital cuff. - Will consider metformin to counteract weight gain from Depakote . - Will attempt to obtain insurance approval for Wegovy  by documenting medical necessity.  Abrasion with mild cellulitis, back of left thigh, acute Abrasion on the back of the left thigh from a stray dog scratch, present for two weeks. Mild cellulitis with redness and swelling, but no deep bite. Healing is progressing, but peroxide use is hindering the process. - Discontinued peroxide use on the abrasion. - Apply Vaseline and cover with a  Band-Aid to promote healing. - Monitor for increased redness or pain and seek medical attention if symptoms worsen.  Seizure disorder Chronic  Managed with Keppra 500 mg three times daily. Continues follow-up with neurologist Dr. Lane. - Continue Keppra 500 mg three times daily. - Continue follow-up with neurologist Dr. Lane.  Chronic pain, bilateral lower extremities (tendinitis) Chronic pain in bilateral lower extremities due to tendinitis, managed with Lyrica  100 mg at bedtime. Reports feet going to sleep at night. - Continue Lyrica  100 mg at bedtime.  Depression Acute adjustment reaction  Reports feeling a little depressed, possibly related to weight gain and discontinuation of semaglutide .  Restless legs syndrome and polyneuropathy Chronic  Restless legs syndrome and polyneuropathy, possibly related to seizure disorder and medication use. Reports tightness in legs and nerve conditions. - continue lyrica  as prescribed by specialist in neurology clinic      Return in about 3 months (around 10/03/2024) for Weight MGMT.         Rockie Agent, MD  [facility] [phone] (phone) [phone] (fax)  [facility]

## 2024-07-04 ENCOUNTER — Other Ambulatory Visit: Payer: Self-pay

## 2024-07-21 ENCOUNTER — Other Ambulatory Visit: Payer: Self-pay

## 2024-07-22 ENCOUNTER — Other Ambulatory Visit: Payer: Self-pay

## 2024-07-23 ENCOUNTER — Other Ambulatory Visit: Payer: Self-pay

## 2024-07-23 MED ORDER — DIVALPROEX SODIUM 500 MG PO DR TAB
500.0000 mg | DELAYED_RELEASE_TABLET | Freq: Three times a day (TID) | ORAL | 2 refills | Status: AC
  Filled 2024-07-23: qty 270, 90d supply, fill #0

## 2024-07-24 ENCOUNTER — Encounter: Payer: Self-pay | Admitting: Family Medicine

## 2024-07-29 ENCOUNTER — Other Ambulatory Visit: Payer: Self-pay

## 2024-07-29 ENCOUNTER — Other Ambulatory Visit: Payer: Self-pay | Admitting: Family Medicine

## 2024-07-29 MED ORDER — PHENTERMINE HCL 37.5 MG PO CAPS
37.5000 mg | ORAL_CAPSULE | ORAL | 0 refills | Status: DC
  Filled 2024-07-29: qty 30, 30d supply, fill #0

## 2024-07-29 MED ORDER — PHENTERMINE HCL 37.5 MG PO CAPS
37.5000 mg | ORAL_CAPSULE | ORAL | 0 refills | Status: DC
  Filled 2024-07-29 – 2024-08-28 (×2): qty 30, 30d supply, fill #0

## 2024-07-30 ENCOUNTER — Other Ambulatory Visit: Payer: Self-pay

## 2024-08-26 ENCOUNTER — Other Ambulatory Visit: Payer: Self-pay

## 2024-08-28 ENCOUNTER — Other Ambulatory Visit: Payer: Self-pay

## 2024-09-11 ENCOUNTER — Other Ambulatory Visit: Payer: Self-pay

## 2024-09-26 ENCOUNTER — Other Ambulatory Visit: Payer: Self-pay | Admitting: Family Medicine

## 2024-09-26 ENCOUNTER — Other Ambulatory Visit: Payer: Self-pay

## 2024-09-26 MED ORDER — PHENTERMINE HCL 37.5 MG PO CAPS
37.5000 mg | ORAL_CAPSULE | ORAL | 0 refills | Status: AC
  Filled 2024-09-26: qty 30, 30d supply, fill #0

## 2024-10-03 ENCOUNTER — Ambulatory Visit: Admitting: Family Medicine
